# Patient Record
Sex: Female | Born: 1955 | Race: White | Hispanic: No | Marital: Married | Smoking: Former smoker
Health system: Southern US, Community
[De-identification: ages and names within clinical notes are randomized; demographics above are authoritative.]

## PROBLEM LIST (undated history)

## (undated) DIAGNOSIS — M1990 Unspecified osteoarthritis, unspecified site: Secondary | ICD-10-CM

## (undated) DIAGNOSIS — N950 Postmenopausal bleeding: Secondary | ICD-10-CM

## (undated) DIAGNOSIS — F32A Depression, unspecified: Secondary | ICD-10-CM

## (undated) DIAGNOSIS — T7840XA Allergy, unspecified, initial encounter: Secondary | ICD-10-CM

## (undated) DIAGNOSIS — C801 Malignant (primary) neoplasm, unspecified: Secondary | ICD-10-CM

## (undated) DIAGNOSIS — F419 Anxiety disorder, unspecified: Secondary | ICD-10-CM

## (undated) DIAGNOSIS — C4491 Basal cell carcinoma of skin, unspecified: Secondary | ICD-10-CM

## (undated) HISTORY — PX: COLONOSCOPY: SHX174

## (undated) HISTORY — DX: Basal cell carcinoma of skin, unspecified: C44.91

## (undated) HISTORY — DX: Allergy, unspecified, initial encounter: T78.40XA

## (undated) HISTORY — PX: DILATION AND CURETTAGE OF UTERUS: SHX78

---
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
## 2004-09-18 ENCOUNTER — Ambulatory Visit: Payer: Self-pay

## 2005-10-09 ENCOUNTER — Ambulatory Visit: Payer: Self-pay

## 2006-10-14 ENCOUNTER — Ambulatory Visit: Payer: Self-pay

## 2006-12-03 ENCOUNTER — Ambulatory Visit: Payer: Self-pay | Admitting: Gastroenterology

## 2007-10-15 ENCOUNTER — Ambulatory Visit: Payer: Self-pay

## 2008-10-18 ENCOUNTER — Ambulatory Visit: Payer: Self-pay

## 2009-10-19 ENCOUNTER — Ambulatory Visit: Payer: Self-pay

## 2010-10-24 ENCOUNTER — Ambulatory Visit: Payer: Self-pay

## 2010-11-05 ENCOUNTER — Ambulatory Visit: Payer: Self-pay

## 2011-02-12 ENCOUNTER — Ambulatory Visit: Payer: Self-pay

## 2011-10-28 ENCOUNTER — Ambulatory Visit: Payer: Self-pay

## 2012-10-28 ENCOUNTER — Ambulatory Visit: Payer: Self-pay

## 2013-11-02 ENCOUNTER — Ambulatory Visit: Payer: Self-pay

## 2014-09-27 ENCOUNTER — Other Ambulatory Visit: Payer: Self-pay | Admitting: Obstetrics and Gynecology

## 2014-09-27 DIAGNOSIS — Z1231 Encounter for screening mammogram for malignant neoplasm of breast: Secondary | ICD-10-CM

## 2014-09-29 ENCOUNTER — Encounter: Payer: Self-pay | Admitting: *Deleted

## 2014-10-04 ENCOUNTER — Ambulatory Visit: Payer: Self-pay | Admitting: Unknown Physician Specialty

## 2014-11-07 ENCOUNTER — Ambulatory Visit
Admission: RE | Admit: 2014-11-07 | Discharge: 2014-11-07 | Disposition: A | Discharge status: Home / Self Care | Payer: BLUE CROSS/BLUE SHIELD | Source: Ambulatory Visit | Attending: Obstetrics and Gynecology | Admitting: Obstetrics and Gynecology

## 2014-11-07 DIAGNOSIS — Z1231 Encounter for screening mammogram for malignant neoplasm of breast: Secondary | ICD-10-CM | POA: Insufficient documentation

## 2014-11-07 HISTORY — DX: Malignant (primary) neoplasm, unspecified: C80.1

## 2015-10-03 ENCOUNTER — Other Ambulatory Visit: Payer: Self-pay | Admitting: Obstetrics and Gynecology

## 2015-10-03 DIAGNOSIS — Z1231 Encounter for screening mammogram for malignant neoplasm of breast: Secondary | ICD-10-CM

## 2015-11-08 ENCOUNTER — Ambulatory Visit
Admission: RE | Admit: 2015-11-08 | Discharge: 2015-11-08 | Disposition: A | Discharge status: Home / Self Care | Payer: BLUE CROSS/BLUE SHIELD | Source: Ambulatory Visit | Attending: Obstetrics and Gynecology | Admitting: Obstetrics and Gynecology

## 2015-11-08 ENCOUNTER — Other Ambulatory Visit: Payer: Self-pay | Admitting: Obstetrics and Gynecology

## 2015-11-08 DIAGNOSIS — Z1231 Encounter for screening mammogram for malignant neoplasm of breast: Secondary | ICD-10-CM | POA: Diagnosis present

## 2016-10-03 ENCOUNTER — Other Ambulatory Visit: Payer: Self-pay | Admitting: Obstetrics and Gynecology

## 2016-10-03 DIAGNOSIS — Z1231 Encounter for screening mammogram for malignant neoplasm of breast: Secondary | ICD-10-CM

## 2016-11-08 ENCOUNTER — Ambulatory Visit
Admission: RE | Admit: 2016-11-08 | Discharge: 2016-11-08 | Disposition: A | Discharge status: Home / Self Care | Payer: BLUE CROSS/BLUE SHIELD | Source: Ambulatory Visit | Attending: Obstetrics and Gynecology | Admitting: Obstetrics and Gynecology

## 2016-11-08 DIAGNOSIS — Z1231 Encounter for screening mammogram for malignant neoplasm of breast: Secondary | ICD-10-CM | POA: Insufficient documentation

## 2017-10-07 ENCOUNTER — Other Ambulatory Visit: Payer: Self-pay | Admitting: Obstetrics and Gynecology

## 2017-10-07 DIAGNOSIS — Z1231 Encounter for screening mammogram for malignant neoplasm of breast: Secondary | ICD-10-CM

## 2017-11-10 ENCOUNTER — Ambulatory Visit
Admission: RE | Admit: 2017-11-10 | Discharge: 2017-11-10 | Disposition: A | Discharge status: Home / Self Care | Payer: BLUE CROSS/BLUE SHIELD | Source: Ambulatory Visit | Attending: Obstetrics and Gynecology | Admitting: Obstetrics and Gynecology

## 2017-11-10 DIAGNOSIS — Z1231 Encounter for screening mammogram for malignant neoplasm of breast: Secondary | ICD-10-CM | POA: Insufficient documentation

## 2018-11-12 ENCOUNTER — Other Ambulatory Visit: Payer: Self-pay | Admitting: Obstetrics and Gynecology

## 2018-11-12 DIAGNOSIS — Z1231 Encounter for screening mammogram for malignant neoplasm of breast: Secondary | ICD-10-CM

## 2018-12-18 ENCOUNTER — Other Ambulatory Visit: Payer: Self-pay

## 2018-12-18 ENCOUNTER — Ambulatory Visit
Admission: RE | Admit: 2018-12-18 | Discharge: 2018-12-18 | Disposition: A | Discharge status: Home / Self Care | Payer: BC Managed Care – PPO | Source: Ambulatory Visit | Attending: Obstetrics and Gynecology | Admitting: Obstetrics and Gynecology

## 2018-12-18 DIAGNOSIS — Z1231 Encounter for screening mammogram for malignant neoplasm of breast: Secondary | ICD-10-CM | POA: Insufficient documentation

## 2019-11-16 ENCOUNTER — Other Ambulatory Visit: Payer: Self-pay | Admitting: Obstetrics and Gynecology

## 2019-11-16 DIAGNOSIS — Z1231 Encounter for screening mammogram for malignant neoplasm of breast: Secondary | ICD-10-CM

## 2019-12-06 ENCOUNTER — Other Ambulatory Visit: Payer: Self-pay | Admitting: Obstetrics and Gynecology

## 2019-12-21 ENCOUNTER — Ambulatory Visit
Admission: RE | Admit: 2019-12-21 | Discharge: 2019-12-21 | Disposition: A | Discharge status: Home / Self Care | Payer: BC Managed Care – PPO | Source: Ambulatory Visit | Attending: Obstetrics and Gynecology | Admitting: Obstetrics and Gynecology

## 2019-12-21 ENCOUNTER — Other Ambulatory Visit: Payer: Self-pay

## 2019-12-21 DIAGNOSIS — Z1231 Encounter for screening mammogram for malignant neoplasm of breast: Secondary | ICD-10-CM | POA: Diagnosis not present

## 2019-12-22 ENCOUNTER — Other Ambulatory Visit: Payer: Self-pay | Admitting: Obstetrics and Gynecology

## 2019-12-22 DIAGNOSIS — N6310 Unspecified lump in the right breast, unspecified quadrant: Secondary | ICD-10-CM

## 2019-12-22 DIAGNOSIS — N6459 Other signs and symptoms in breast: Secondary | ICD-10-CM

## 2019-12-29 ENCOUNTER — Other Ambulatory Visit: Payer: Self-pay | Admitting: Obstetrics and Gynecology

## 2019-12-29 DIAGNOSIS — R928 Other abnormal and inconclusive findings on diagnostic imaging of breast: Secondary | ICD-10-CM

## 2019-12-29 DIAGNOSIS — N6310 Unspecified lump in the right breast, unspecified quadrant: Secondary | ICD-10-CM

## 2019-12-30 NOTE — H&P (Signed)
Hannah Burke is a 64 y.o. female here for Pre Op Consulting (sign consents)  Body mass index is 26.57 kg/m.  History of Present Illness:  Patient presents for a preoperative visit for D&C, hysteroscopy, and Polypectomy. Indications are postmenopausal bleeding, endometrial polyps, thickened endometrium. Work up with SIS also revealed polyps and possible submucosal fibroids.   Workup has included: Last pap smear: 10/2019 neg/neg EMBx 04/2019: Inactive, benign  SIS 05/2019: polyps and submucosal fibroids  TVUS 05/2019:  Retroverted Ut=5.24 x 3.53 x 4.45 cm Endometrium=14.34 mm Polyps seen, 1=0.62 x 0.43 x 0.76 cm,  2=0.76 x 0.85 x 1.06 cm Neither ov seen,  NoAdnexal masses seen  No pertinent surgical hx  Past Medical History:  has a past medical history of Osteoporosis, post-menopausal.  Past Surgical History:  has a past surgical history that includes excision of basel cell and Colonoscopy (12/03/2014). Family History: family history includes Diabetes in her father; Osteoporosis (Thinning of bones) in her mother; Stroke in her mother; Thyroid disease in her mother. Social History:  reports that she has quit smoking. She has never used smokeless tobacco. She reports current alcohol use. She reports that she does not use drugs. OB/GYN History:  OB History    Gravida  2   Para  2   Term  2   Preterm      AB      Living  2     SAB      TAB      Ectopic      Molar      Multiple      Live Births           Allergies: is allergic to amoxicillin and penicillins. Medications:  Current Outpatient Medications:  .  cholecalciferol (CHOLECALCIFEROL) 1,000 unit tablet, Take by mouth., Disp: , Rfl:  .  docosahexanoic acid/epa (FISH OIL ORAL), Take by mouth, Disp: , Rfl:  .  loratadine (CLARITIN) 10 mg capsule, Take 10 mg by mouth once daily., Disp: , Rfl:  .  nystatin (MYCOSTATIN) 100,000 unit/gram powder, Apply topically 2 (two) times daily, Disp: 30 g, Rfl: 1 .  wheat  dextrin/calc gluc,lact (BENEFIBER PLUS CALCIUM ORAL), Take by mouth, Disp: , Rfl:  .  alendronate (FOSAMAX) 70 MG tablet, TAKE 1 TAB BY MOUTH EACH WEEK , ON A EMPTY STOMACH BEFORE BREAKFAST WITH 8OZ OF WATER AND REMAIN UPRIGHT FOR 30 MIN (Patient not taking: Reported on 05/25/2019), Disp: 4 tablet, Rfl: 11 .  alendronate (FOSAMAX) 70 MG tablet, TAKE ONE TABLET BY MOUTH EACH WEEK, ON AN EMPTY STOMACH BEFORE BREAKFAST WITH 8oz OF WATER AND REMAIN UPRIGHT FOR :30 (Patient not taking: Reported on 05/25/2019), Disp: 4 tablet, Rfl: 12 .  aspirin 81 MG EC tablet, Take 81 mg by mouth once daily., Disp: , Rfl:  .  biotin 1 mg tablet, Take 1,000 mcg by mouth 3 (three) times daily (Patient not taking: Reported on 11/16/2019  ), Disp: , Rfl:    Exam:   BP 110/78   Ht 160 cm (5\' 3" )   Wt 68 kg (150 lb)   BMI 26.57 kg/m   Constitutional:  General appearance: Well nourished, well developed female in no acute distress.  Neuro/psych:  Normal mood and affect. No gross motor deficits. Neck:  Supple, normal appearance.  Respiratory:  Normal respiratory effort, no use of accessory muscles Skin:  No visible rashes or external lesions  Impression:   The primary encounter diagnosis was Preoperative clearance. Diagnoses of Postmenopausal bleeding, Endometrial  polyp, and Endometrial thickening on ultrasound were also pertinent to this visit.  Plan:   1. Preoperative visit: D&C hysteroscopy, Polypectomy. Consents signed today. Risks of surgery were discussed with the patient including but not limited to: bleeding which may require transfusion; infection which may require antibiotics; injury to uterus or surrounding organs; intrauterine scarring which may impair future fertility; need for additional procedures including laparotomy or laparoscopy; and other postoperative/anesthesia complications. Written informed consent was obtained.  Return for Postop check.  Diagnoses and all orders for this visit:  Preoperative  clearance  Postmenopausal bleeding  Endometrial polyp  Endometrial thickening on ultrasound

## 2020-01-07 ENCOUNTER — Ambulatory Visit
Admission: RE | Admit: 2020-01-07 | Discharge: 2020-01-07 | Disposition: A | Discharge status: Home / Self Care | Payer: Medicare Other | Source: Ambulatory Visit | Attending: Obstetrics and Gynecology | Admitting: Obstetrics and Gynecology

## 2020-01-07 DIAGNOSIS — N6310 Unspecified lump in the right breast, unspecified quadrant: Secondary | ICD-10-CM

## 2020-01-07 DIAGNOSIS — R928 Other abnormal and inconclusive findings on diagnostic imaging of breast: Secondary | ICD-10-CM | POA: Insufficient documentation

## 2020-01-13 ENCOUNTER — Encounter
Admission: RE | Admit: 2020-01-13 | Discharge: 2020-01-13 | Disposition: A | Discharge status: Home / Self Care | Payer: Medicare Other | Source: Ambulatory Visit | Attending: Obstetrics and Gynecology | Admitting: Obstetrics and Gynecology

## 2020-01-13 ENCOUNTER — Other Ambulatory Visit: Payer: Self-pay

## 2020-01-13 NOTE — Patient Instructions (Signed)
Your procedure is scheduled on: Friday 9/24 Report to Day Surgery. To find out your arrival time please call [phone] between 1PM - 3PM on Thurs 9/23  Remember: Instructions that are not followed completely may result in serious medical risk,  up to and including death, or upon the discretion of your surgeon and anesthesiologist your  surgery may need to be rescheduled.     _X__ 1. Do not eat food after midnight the night before your procedure.                 No chewing gum or hard candies. You may drink clear liquids up to 2 hours                 before you are scheduled to arrive for your surgery- DO not drink clear                 liquids within 2 hours of the start of your surgery.                 Clear Liquids include:  water, apple juice without pulp, clear Gatorade, G2 or                  Gatorade Zero (avoid Red/Purple/Blue), Black Coffee or Tea (Do not add                 anything to coffee or tea). __x___2.   Complete the "Ensure Clear Pre-surgery Clear Carbohydrate Drink" provided to you, 2 hours before arrival.   __X__2.  On the morning of surgery brush your teeth with toothpaste and water, you                may rinse your mouth with mouthwash if you wish.  Do not swallow any toothpaste of mouthwash.     _X__ 3.  No Alcohol for 24 hours before or after surgery.   ___ 4.  Do Not Smoke or use e-cigarettes For 24 Hours Prior to Your Surgery.                 Do not use any chewable tobacco products for at least 6 hours prior to                 Surgery.  ___  5.  Do not use any recreational drugs (marijuana, cocaine, heroin, ecstasy, MDMA or other)                For at least one week prior to your surgery.  Combination of these drugs with anesthesia                May have life threatening results.  ____  6.  Bring all medications with you on the day of surgery if instructed.   _x___  7.  Notify your doctor if there is any change in your medical  condition      (cold, fever, infections).     Do not wear jewelry, make-up, hairpins, clips or nail polish. Do not wear lotions, powders, or perfumes. You may wear deodorant. Do not shave 48 hours prior to surgery.  Do not bring valuables to the hospital.    [facility] is not responsible for any belongings or valuables.  Contacts, dentures or bridgework may not be worn into surgery. Leave your suitcase in the car. After surgery it may be brought to your room. For patients admitted to the hospital, discharge time is determined by your treatment  team.   Patients discharged the day of surgery will not be allowed to drive home.   Make arrangements for someone to be with you for the first 24 hours of your Same Day Discharge.    Please read over the following fact sheets that you were given:   Incentive spirometer    __x__ Take these medicines the morning of surgery with A SIP OF WATER:    1. none  2.   3.   4.  5.  6.  ____ Fleet Enema (as directed)   ____ Use CHG Soap (or wipes) as directed  ____ Use Benzoyl Peroxide Gel as instructed  ____ Use inhalers on the day of surgery  ____ Stop metformin 2 days prior to surgery    ____ Take 1/2 of usual insulin dose the night before surgery. No insulin the morning          of surgery.   _x___ Stop aspirin tomorrow  __x__ Stop Anti-inflammatories no ibuprofen aleve or motrin until after the surgery.   May take tylenol   __x__ Stop supplements  Omega 3 1200 MG CAPS tomorrow   ____ Bring C-Pap to the hospital.    If you have any questions regarding your pre-procedure instructions,  Please call Pre-admit Testing at [facility] [place]

## 2020-01-19 ENCOUNTER — Encounter
Admission: RE | Admit: 2020-01-19 | Discharge: 2020-01-19 | Disposition: A | Discharge status: Home / Self Care | Payer: Medicare Other | Source: Ambulatory Visit | Attending: Obstetrics and Gynecology | Admitting: Obstetrics and Gynecology

## 2020-01-19 ENCOUNTER — Other Ambulatory Visit: Payer: BC Managed Care – PPO

## 2020-01-19 ENCOUNTER — Other Ambulatory Visit: Payer: Self-pay

## 2020-01-19 DIAGNOSIS — R54 Age-related physical debility: Secondary | ICD-10-CM | POA: Insufficient documentation

## 2020-01-19 DIAGNOSIS — Z01818 Encounter for other preprocedural examination: Secondary | ICD-10-CM | POA: Insufficient documentation

## 2020-01-19 DIAGNOSIS — Z20822 Contact with and (suspected) exposure to covid-19: Secondary | ICD-10-CM | POA: Insufficient documentation

## 2020-01-19 LAB — TYPE AND SCREEN
ABO/RH(D): O NEG
Antibody Screen: NEGATIVE

## 2020-01-20 LAB — SARS CORONAVIRUS 2 (TAT 6-24 HRS): SARS Coronavirus 2: NEGATIVE

## 2020-01-21 ENCOUNTER — Ambulatory Visit: Payer: Medicare Other | Admitting: Certified Registered Nurse Anesthetist

## 2020-01-21 ENCOUNTER — Ambulatory Visit
Admission: RE | Admit: 2020-01-21 | Discharge: 2020-01-21 | Disposition: A | Discharge status: Home / Self Care | Payer: Medicare Other | Attending: Obstetrics and Gynecology | Admitting: Obstetrics and Gynecology

## 2020-01-21 ENCOUNTER — Encounter
Admission: RE | Disposition: A | Discharge status: Home / Self Care | Payer: Self-pay | Source: Home / Self Care | Attending: Obstetrics and Gynecology

## 2020-01-21 ENCOUNTER — Encounter: Payer: Self-pay | Admitting: Obstetrics and Gynecology

## 2020-01-21 DIAGNOSIS — Z823 Family history of stroke: Secondary | ICD-10-CM | POA: Insufficient documentation

## 2020-01-21 DIAGNOSIS — Z7982 Long term (current) use of aspirin: Secondary | ICD-10-CM | POA: Diagnosis not present

## 2020-01-21 DIAGNOSIS — Z833 Family history of diabetes mellitus: Secondary | ICD-10-CM | POA: Diagnosis not present

## 2020-01-21 DIAGNOSIS — N950 Postmenopausal bleeding: Secondary | ICD-10-CM | POA: Insufficient documentation

## 2020-01-21 DIAGNOSIS — Z79899 Other long term (current) drug therapy: Secondary | ICD-10-CM | POA: Insufficient documentation

## 2020-01-21 DIAGNOSIS — R9389 Abnormal findings on diagnostic imaging of other specified body structures: Secondary | ICD-10-CM | POA: Diagnosis not present

## 2020-01-21 DIAGNOSIS — Z880 Allergy status to penicillin: Secondary | ICD-10-CM | POA: Insufficient documentation

## 2020-01-21 DIAGNOSIS — M810 Age-related osteoporosis without current pathological fracture: Secondary | ICD-10-CM | POA: Insufficient documentation

## 2020-01-21 DIAGNOSIS — N840 Polyp of corpus uteri: Secondary | ICD-10-CM | POA: Diagnosis not present

## 2020-01-21 DIAGNOSIS — Z8349 Family history of other endocrine, nutritional and metabolic diseases: Secondary | ICD-10-CM | POA: Insufficient documentation

## 2020-01-21 DIAGNOSIS — D250 Submucous leiomyoma of uterus: Secondary | ICD-10-CM | POA: Diagnosis not present

## 2020-01-21 DIAGNOSIS — Z8262 Family history of osteoporosis: Secondary | ICD-10-CM | POA: Diagnosis not present

## 2020-01-21 DIAGNOSIS — Z87891 Personal history of nicotine dependence: Secondary | ICD-10-CM | POA: Diagnosis not present

## 2020-01-21 DIAGNOSIS — Z7983 Long term (current) use of bisphosphonates: Secondary | ICD-10-CM | POA: Insufficient documentation

## 2020-01-21 HISTORY — PX: HYSTEROSCOPY WITH D & C: SHX1775

## 2020-01-21 LAB — ABO/RH: ABO/RH(D): O NEG

## 2020-01-21 SURGERY — DILATATION AND CURETTAGE /HYSTEROSCOPY
Anesthesia: General | Site: Uterus

## 2020-01-21 MED ORDER — FENTANYL CITRATE (PF) 100 MCG/2ML IJ SOLN
INTRAMUSCULAR | Status: DC | PRN
Start: 2020-01-21 — End: 2020-01-21
  Administered 2020-01-21: 25 ug via INTRAVENOUS

## 2020-01-21 MED ORDER — MIDAZOLAM HCL 2 MG/2ML IJ SOLN
INTRAMUSCULAR | Status: AC
  Filled 2020-01-21: qty 2

## 2020-01-21 MED ORDER — DEXAMETHASONE SODIUM PHOSPHATE 10 MG/ML IJ SOLN
INTRAMUSCULAR | Status: DC | PRN
  Administered 2020-01-21: 8 mg via INTRAVENOUS

## 2020-01-21 MED ORDER — PROPOFOL 10 MG/ML IV BOLUS
INTRAVENOUS | Status: DC | PRN
  Administered 2020-01-21: 200 mg via INTRAVENOUS

## 2020-01-21 MED ORDER — LACTATED RINGERS IV SOLN: INTRAVENOUS | Status: DC

## 2020-01-21 MED ORDER — ACETAMINOPHEN 10 MG/ML IV SOLN
INTRAVENOUS | Status: AC
  Filled 2020-01-21: qty 100

## 2020-01-21 MED ORDER — PROPOFOL 10 MG/ML IV BOLUS
INTRAVENOUS | Status: AC
  Filled 2020-01-21: qty 60

## 2020-01-21 MED ORDER — LIDOCAINE HCL (CARDIAC) PF 100 MG/5ML IV SOSY
PREFILLED_SYRINGE | INTRAVENOUS | Status: DC | PRN
  Administered 2020-01-21: 100 mg via INTRAVENOUS

## 2020-01-21 MED ORDER — CHLORHEXIDINE GLUCONATE 0.12 % MT SOLN: 15.0000 mL | Freq: Once | OROMUCOSAL | Status: AC

## 2020-01-21 MED ORDER — ACETAMINOPHEN 10 MG/ML IV SOLN
INTRAVENOUS | Status: DC | PRN
  Administered 2020-01-21: 1000 mg via INTRAVENOUS

## 2020-01-21 MED ORDER — IBUPROFEN 800 MG PO TABS: 800.0000 mg | ORAL_TABLET | Freq: Three times a day (TID) | ORAL | 1 refills | Status: AC

## 2020-01-21 MED ORDER — ONDANSETRON HCL 4 MG/2ML IJ SOLN: 4.0000 mg | Freq: Once | INTRAMUSCULAR | Status: DC | PRN

## 2020-01-21 MED ORDER — IBUPROFEN 800 MG PO TABS
800.0000 mg | ORAL_TABLET | Freq: Once | ORAL | Status: AC
  Filled 2020-01-21: qty 1

## 2020-01-21 MED ORDER — FAMOTIDINE 20 MG PO TABS
20.0000 mg | ORAL_TABLET | Freq: Once | ORAL | Status: AC
  Administered 2020-01-21: 20 mg via ORAL

## 2020-01-21 MED ORDER — PHENYLEPHRINE HCL (PRESSORS) 10 MG/ML IV SOLN
INTRAVENOUS | Status: DC | PRN
  Administered 2020-01-21: 100 ug via INTRAVENOUS

## 2020-01-21 MED ORDER — CHLORHEXIDINE GLUCONATE 0.12 % MT SOLN
OROMUCOSAL | Status: AC
  Administered 2020-01-21: 15 mL via OROMUCOSAL
  Filled 2020-01-21: qty 15

## 2020-01-21 MED ORDER — ONDANSETRON HCL 4 MG/2ML IJ SOLN
INTRAMUSCULAR | Status: DC | PRN
  Administered 2020-01-21: 4 mg via INTRAVENOUS

## 2020-01-21 MED ORDER — IBUPROFEN 800 MG PO TABS
ORAL_TABLET | ORAL | Status: AC
  Administered 2020-01-21: 800 mg via ORAL
  Filled 2020-01-21: qty 1

## 2020-01-21 MED ORDER — ORAL CARE MOUTH RINSE: 15.0000 mL | Freq: Once | OROMUCOSAL | Status: AC

## 2020-01-21 MED ORDER — FAMOTIDINE 20 MG PO TABS
ORAL_TABLET | ORAL | Status: AC
  Filled 2020-01-21: qty 1

## 2020-01-21 MED ORDER — MIDAZOLAM HCL 2 MG/2ML IJ SOLN
INTRAMUSCULAR | Status: DC | PRN
  Administered 2020-01-21: 2 mg via INTRAVENOUS

## 2020-01-21 MED ORDER — FENTANYL CITRATE (PF) 100 MCG/2ML IJ SOLN: 25.0000 ug | INTRAMUSCULAR | Status: DC | PRN

## 2020-01-21 MED ORDER — FENTANYL CITRATE (PF) 100 MCG/2ML IJ SOLN
INTRAMUSCULAR | Status: AC
  Filled 2020-01-21: qty 2

## 2020-01-21 SURGICAL SUPPLY — 22 items
BAG INFUSER PRESSURE 100CC (MISCELLANEOUS) ×3 IMPLANT
CANISTER SUCT 3000ML PPV (MISCELLANEOUS) ×3 IMPLANT
CATH ROBINSON RED A/P 16FR (CATHETERS) ×3 IMPLANT
COVER WAND RF STERILE (DRAPES) ×3 IMPLANT
DEVICE MYOSURE LITE (MISCELLANEOUS) ×3 IMPLANT
ELECT REM PT RETURN 9FT ADLT (ELECTROSURGICAL) ×3
ELECTRODE REM PT RTRN 9FT ADLT (ELECTROSURGICAL) ×1 IMPLANT
GAUZE 4X4 16PLY RFD (DISPOSABLE) ×3 IMPLANT
GLOVE BIO SURGEON STRL SZ7 (GLOVE) ×3 IMPLANT
GLOVE INDICATOR 7.5 STRL GRN (GLOVE) ×3 IMPLANT
GOWN STRL REUS W/ TWL LRG LVL3 (GOWN DISPOSABLE) ×2 IMPLANT
GOWN STRL REUS W/TWL LRG LVL3 (GOWN DISPOSABLE) ×6
KIT PROCEDURE FLUENT (KITS) ×3 IMPLANT
KIT TURNOVER CYSTO (KITS) ×3 IMPLANT
PACK DNC HYST (MISCELLANEOUS) ×3 IMPLANT
PAD OB MATERNITY 4.3X12.25 (PERSONAL CARE ITEMS) ×3 IMPLANT
PAD PREP 24X41 OB/GYN DISP (PERSONAL CARE ITEMS) ×3 IMPLANT
SOL .9 NS 3000ML IRR  AL (IV SOLUTION) ×2
SOL .9 NS 3000ML IRR AL (IV SOLUTION) ×1
SOL .9 NS 3000ML IRR UROMATIC (IV SOLUTION) ×1 IMPLANT
TUBING CONNECTING 10 (TUBING) ×2 IMPLANT
TUBING CONNECTING 10' (TUBING) ×1

## 2020-01-21 NOTE — Transfer of Care (Signed)
Immediate Anesthesia Transfer of Care Note  Patient: Hannah Burke  Procedure(s) Performed: DILATATION AND CURETTAGE /HYSTEROSCOPY, POLYPECTOMY (N/A Uterus)  Patient Location: PACU  Anesthesia Type:General  Level of Consciousness: awake, oriented, drowsy and patient cooperative  Airway & Oxygen Therapy: Patient Spontanous Breathing  Post-op Assessment: Report given to RN, Post -op Vital signs reviewed and stable and Patient moving all extremities  Post vital signs: Reviewed and stable  Last Vitals:  Vitals Value Taken Time  BP 125/85 01/21/20 0833  Temp 36.4 C 01/21/20 0833  Pulse 79 01/21/20 0834  Resp 19 01/21/20 0834  SpO2 99 % 01/21/20 0834  Vitals shown include unvalidated device data.  Last Pain:  Vitals:   01/21/20 0833  TempSrc:   PainSc: 3          Complications: No complications documented.

## 2020-01-21 NOTE — OR Nursing (Signed)
Per Dr. Leafy Ro secure-chat, patient may resume taking aspirin today 01/21/20.  Added to discharge instructions/med section.

## 2020-01-21 NOTE — Anesthesia Postprocedure Evaluation (Signed)
Anesthesia Post Note  Patient: Hannah Burke  Procedure(s) Performed: DILATATION AND CURETTAGE /HYSTEROSCOPY, POLYPECTOMY (N/A Uterus)  Patient location during evaluation: PACU Anesthesia Type: General Level of consciousness: awake and alert Pain management: pain level controlled Vital Signs Assessment: post-procedure vital signs reviewed and stable Respiratory status: spontaneous breathing, nonlabored ventilation, respiratory function stable and patient connected to nasal cannula oxygen Cardiovascular status: blood pressure returned to baseline and stable Postop Assessment: no apparent nausea or vomiting Anesthetic complications: no   No complications documented.   Last Vitals:  Vitals:   01/21/20 0903 01/21/20 0918  BP: 140/67 130/79  Pulse: 69 67  Resp: 11 16  Temp: (!) 36.2 C (!) 36.2 C  SpO2: 100% 100%    Last Pain:  Vitals:   01/21/20 0918  TempSrc: Temporal  PainSc: [place] Mariana Goytia

## 2020-01-21 NOTE — Anesthesia Procedure Notes (Signed)
Procedure Name: LMA Insertion Date/Time: 01/21/2020 7:42 AM Performed by: Lowry Bowl, CRNA Pre-anesthesia Checklist: Patient being monitored, Suction available, Emergency Drugs available and Patient identified Patient Re-evaluated:Patient Re-evaluated prior to induction Oxygen Delivery Method: Circle system utilized Preoxygenation: Pre-oxygenation with 100% oxygen Induction Type: IV induction Ventilation: Mask ventilation without difficulty LMA: LMA inserted LMA Size: 3.0 Number of attempts: 1 Placement Confirmation: positive ETCO2 and breath sounds checked- equal and bilateral Tube secured with: Tape Dental Injury: Teeth and Oropharynx as per pre-operative assessment

## 2020-01-21 NOTE — Discharge Instructions (Addendum)
Discharge instructions after a hysteroscopy with dilation and curettage  Signs and Symptoms to Report  Call our office at [phone] if you have any of the following:   . Fever over 100.4 degrees or higher . Severe stomach pain not relieved with pain medications . Bright red bleeding that's heavier than a period that does not slow with rest after the first 24 hours . To go the bathroom a lot (frequency), you can't hold your urine (urgency), or it hurts when you empty your bladder (urinate) . Chest pain . Shortness of breath . Pain in the calves of your legs . Severe nausea and vomiting not relieved with anti-nausea medications . Any concerns  What You Can Expect after Surgery . You may see some pink tinged, bloody fluid. This is normal. You may also have cramping for several days.   Activities after Your Discharge Follow these guidelines to help speed your recovery at home: . Don't drive if you are in pain or taking narcotic pain medicine. You may drive when you can safely slam on the brakes, turn the wheel forcefully, and rotate your torso comfortably. This is typically 4-7 days. Practice in a parking lot or side street prior to attempting to drive regularly.  . Ask others to help with household chores for 4 weeks. . Don't do strenuous activities, exercises, or sports like vacuuming, tennis, squash, etc. until your doctor says it is safe to do so. . Walk as you feel able. Rest often since it may take a week or two for your energy level to return to normal.  . You may climb stairs . Avoid constipation:   -Eat fruits, vegetables, and whole grains. Eat small meals as your appetite will take time to return to normal.   -Drink 6 to 8 glasses of water each day unless your doctor has told you to limit your fluids.   -Use a laxative or stool softener as needed if constipation becomes a problem. You may take Miralax, metamucil, Citrucil, Colace, [place], FiberCon, etc. If this does not  relieve the constipation, try two tablespoons of Milk Of Magnesia every 8 hours until your bowels move.  . You may shower.  . Do not get in a hot tub, swimming pool, etc. until your doctor agrees. . Do not douche, use tampons, or have sex until your doctor says it is okay, usually about 2 weeks. . Take your pain medicine when you need it. The medicine may not work as well if the pain is bad.  Take the medicines you were taking before surgery. Other medications you might need are pain medications (ibuprofen), medications for constipation (Colace) and nausea medications (Zofran).        AMBULATORY SURGERY  DISCHARGE INSTRUCTIONS   1) The drugs that you were given will stay in your system until tomorrow so for the next 24 hours you should not:  A) Drive an automobile B) Make any legal decisions C) Drink any alcoholic beverage   2) You may resume regular meals tomorrow.  Today it is better to start with liquids and gradually work up to solid foods.  You may eat anything you prefer, but it is better to start with liquids, then soup and crackers, and gradually work up to solid foods.   3) Please notify your doctor immediately if you have any unusual bleeding, trouble breathing, redness and pain at the surgery site, drainage, fever, or pain not relieved by medication.    4) Additional Instructions:          Please contact your physician with any problems or Same Day Surgery at [phone], Monday through Friday 6 am to 4 pm, or [facility] number at [phone]. 

## 2020-01-21 NOTE — Interval H&P Note (Signed)
History and Physical Interval Note:  01/21/2020 7:28 AM  Hannah Burke  has presented today for surgery, with the diagnosis of Burke menopausal bleeding.  The various methods of treatment have been discussed with the patient and family. After consideration of risks, benefits and other options for treatment, the patient has consented to  Procedure(s): DILATATION AND CURETTAGE /HYSTEROSCOPY, POLYPECTOMY (N/A) as a surgical intervention.  The patient's history has been reviewed, patient examined, no change in status, stable for surgery.  I have reviewed the patient's chart and labs.  Questions were answered to the patient's satisfaction.     Benjaman Kindler

## 2020-01-21 NOTE — Anesthesia Preprocedure Evaluation (Signed)
Anesthesia Evaluation  Patient identified by MRN, date of birth, ID band Patient awake    Reviewed: Allergy & Precautions, H&P , NPO status , Patient's Chart, lab work & pertinent test results, reviewed documented beta blocker date and time   Airway Mallampati: II  TM Distance: >3 FB Neck ROM: full    Dental  (+) Teeth Intact   Pulmonary neg pulmonary ROS, former smoker,    Pulmonary exam normal        Cardiovascular Exercise Tolerance: Good negative cardio ROS Normal cardiovascular exam Rate:Normal     Neuro/Psych negative neurological ROS  negative psych ROS   GI/Hepatic negative GI ROS, Neg liver ROS,   Endo/Other  negative endocrine ROS  Renal/GU negative Renal ROS  negative genitourinary   Musculoskeletal   Abdominal   Peds  Hematology negative hematology ROS (+)   Anesthesia Other Findings   Reproductive/Obstetrics negative OB ROS                             Anesthesia Physical Anesthesia Plan  ASA: II  Anesthesia Plan: General LMA   Post-op Pain Management:    Induction:   PONV Risk Score and Plan: 4 or greater  Airway Management Planned:   Additional Equipment:   Intra-op Plan:   Post-operative Plan:   Informed Consent: I have reviewed the patients History and Physical, chart, labs and discussed the procedure including the risks, benefits and alternatives for the proposed anesthesia with the patient or authorized representative who has indicated his/her understanding and acceptance.       Plan Discussed with: CRNA  Anesthesia Plan Comments:         Anesthesia Quick Evaluation

## 2020-01-21 NOTE — Op Note (Signed)
Operative Report Hysteroscopy with Dilation and Curettage   Indications: Postmenopausal bleeding   Pre-operative Diagnosis: Thickened endometrial stripe   Post-operative Diagnosis: same.  Procedure: 1. Exam under anesthesia 2. Fractional D&C 3. Hysteroscopy 4. Myosure polypectomy/myomectomy  Surgeon: Benjaman Kindler, MD  Assistant(s):  None  Anesthesia: General LMA anesthesia  Anesthesiologist: Molli Barrows, MD Anesthesiologist: Molli Barrows, MD CRNA: Lowry Bowl, CRNA  Estimated Blood Loss:  Minimal         Total IV Fluids: 674ml  Urine Output: 159ml  Total Fluid Deficit:  270 mL          Specimens: Endocervical curettings, endometrial curettings         Complications:  None; patient tolerated the procedure well.         Disposition: PACU - hemodynamically stable.         Condition: stable  Findings: Uterus measuring 7.5 cm by sound; normal cervix, vagina, perineum. Two fundal polyps, two possible fibroid vs polyp in the posterior lower uterine segment, normal tubal ostia.  Indication for procedure/Consents: 64 y.o. F here for scheduled surgery for the aforementioned diagnoses.  Risks of surgery were discussed with the patient including but not limited to: bleeding which may require transfusion; infection which may require antibiotics; injury to uterus or surrounding organs; intrauterine scarring which may impair future fertility; need for additional procedures including laparotomy or laparoscopy; and other postoperative/anesthesia complications. Written informed consent was obtained.    Procedure Details:  D&C/ Myosure  The patient was taken to the operating room where anesthesia was administered and was found to be adequate. After a formal and adequate timeout was performed, she was placed in the dorsal lithotomy position and examined with the above findings. She was then prepped and draped in the sterile manner. Her bladder was catheterized for an estimated  amount of clear, yellow urine. A weighed speculum was then placed in the patient's vagina and a single tooth tenaculum was applied to the anterior lip of the cervix.  Her cervix was serially dilated to 15 [place] using Hanks dilators. An ECC was performed. The hysteroscope was introduced under direct observation  Using lactated ringers as a distention medium to reveal the above findings. The uterine cavity was carefully examined, both ostia were recognized, and diffusely proliferative endometrium with the fibroid or polyp above were noted.   This was resected using the Myosure device.  After further careful visualization of the uterine cavity, the hysteroscope was removed under direct visualization.  A sharp curettage was then performed until there was a gritty texture in all four quadrants. The tenaculum was removed from the anterior lip of the cervix and the vaginal speculum was removed after noting good hemostasis.   The patient tolerated the procedure well and was taken to the recovery area awake and in stable condition. She received iv acetaminophen and Toradol prior to leaving the OR.  The patient will be discharged to home as per PACU criteria. Routine postoperative instructions given. She was prescribed Ibuprofen and Colace. She will follow up in the clinic in two weeks for postoperative evaluation.

## 2020-01-24 LAB — SURGICAL PATHOLOGY

## 2020-01-28 DIAGNOSIS — C801 Malignant (primary) neoplasm, unspecified: Secondary | ICD-10-CM

## 2020-01-28 HISTORY — DX: Malignant (primary) neoplasm, unspecified: C80.1

## 2020-02-09 ENCOUNTER — Other Ambulatory Visit: Payer: Self-pay | Admitting: Nurse Practitioner

## 2020-02-09 ENCOUNTER — Inpatient Hospital Stay: Payer: Medicare Other

## 2020-02-09 ENCOUNTER — Other Ambulatory Visit: Payer: Self-pay

## 2020-02-09 ENCOUNTER — Inpatient Hospital Stay: Payer: Medicare Other | Attending: Obstetrics and Gynecology | Admitting: Obstetrics and Gynecology

## 2020-02-09 VITALS — BP 156/97 | HR 82 | Resp 16 | Ht 63.6 in | Wt 152.6 lb

## 2020-02-09 DIAGNOSIS — Z7982 Long term (current) use of aspirin: Secondary | ICD-10-CM | POA: Insufficient documentation

## 2020-02-09 DIAGNOSIS — Z87891 Personal history of nicotine dependence: Secondary | ICD-10-CM | POA: Insufficient documentation

## 2020-02-09 DIAGNOSIS — N8502 Endometrial intraepithelial neoplasia [EIN]: Secondary | ICD-10-CM

## 2020-02-09 LAB — COMPREHENSIVE METABOLIC PANEL
ALT: 32 U/L (ref 0–44)
AST: 24 U/L (ref 15–41)
Albumin: 4.1 g/dL (ref 3.5–5.0)
Alkaline Phosphatase: 75 U/L (ref 38–126)
Anion gap: 9 (ref 5–15)
BUN: 10 mg/dL (ref 8–23)
CO2: 27 mmol/L (ref 22–32)
Calcium: 9.4 mg/dL (ref 8.9–10.3)
Chloride: 102 mmol/L (ref 98–111)
Creatinine, Ser: 0.67 mg/dL (ref 0.44–1.00)
GFR, Estimated: >60 mL/min (ref >60)
Glucose, Bld: 111 mg/dL — ABNORMAL HIGH (ref 70–99)
Potassium: 4.5 mmol/L (ref 3.5–5.1)
Sodium: 138 mmol/L (ref 135–145)
Total Bilirubin: 0.7 mg/dL (ref 0.3–1.2)
Total Protein: 7.9 g/dL (ref 6.5–8.1)

## 2020-02-09 LAB — CBC WITH DIFFERENTIAL/PLATELET
Abs Immature Granulocytes: 0.02 10*3/uL (ref 0.00–0.07)
Basophils Absolute: 0.1 10*3/uL (ref 0.0–0.1)
Basophils Relative: 1 %
Eosinophils Absolute: 0.3 10*3/uL (ref 0.0–0.5)
Eosinophils Relative: 4 %
HCT: 44.6 % (ref 36.0–46.0)
Hemoglobin: 15.3 g/dL — ABNORMAL HIGH (ref 12.0–15.0)
Immature Granulocytes: 0 %
Lymphocytes Relative: 35 %
Lymphs Abs: 2.5 10*3/uL (ref 0.7–4.0)
MCH: 30.9 pg (ref 26.0–34.0)
MCHC: 34.3 g/dL (ref 30.0–36.0)
MCV: 90.1 fL (ref 80.0–100.0)
Monocytes Absolute: 0.6 10*3/uL (ref 0.1–1.0)
Monocytes Relative: 8 %
Neutro Abs: 3.8 10*3/uL (ref 1.7–7.7)
Neutrophils Relative %: 52 %
Platelets: 321 10*3/uL (ref 150–400)
RBC: 4.95 MIL/uL (ref 3.87–5.11)
RDW: 13.5 % (ref 11.5–15.5)
WBC: 7.3 10*3/uL (ref 4.0–10.5)
nRBC: 0 % (ref 0.0–0.2)

## 2020-02-09 NOTE — Progress Notes (Signed)
Gynecologic Oncology Consult Visit   Referring Provider: Benjaman Kindler, MD  Chief Concern: EIN  Subjective:  Hannah Burke is a 64 y.o. G2P2 female who is seen in consultation from Dr. Leafy Ro for EIN.  She presented initially with postmenopausal bleeding. Work up included the following: Last pap smear: 10/2019 neg/neg EMBx1/2021: Inactive, benign  SIS 05/2019: polyps and submucosal fibroids  TVUS 05/2019:  Retroverted Ut=5.24 x 3.53 x 4.45 cm Endometrium=14.34 mm Polyps seen,1=0.62 x 0.43 x 0.76 cm, 2=0.76 x 0.85 x 1.06 cm Neither ov seen,NoAdnexal masses seen  On 01/21/2020 she underwent exam under anesthesia, fractional D&C, hysteroscopy, and Myosure polypectomy/myomectomy. Uterus sounded to 7.5 cm and findings demonstrated two fundal polyps, two possible fibroid vs polyp in the posterior lower uterine segment, normal tubal ostia.  Her final pathology is noted below.    DIAGNOSIS:  A. ENDOCERVIX, CURETTAGE:  - BENIGN ENDOCERVICAL AND SQUAMOUS MUCOSA.  - NEGATIVE FOR DYSPLASIA AND MALIGNANCY.   B. ENDOMETRIUM, CURETTAGE:  - SCANT, SUPERFICIAL FRAGMENTS OF EIN / ATYPICAL HYPERPLASIA, SIMILAR TO  THAT SEEN IN PART B.   C. 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   Problem List: Patient Active Problem List   Diagnosis Date Noted  . 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 Allergy   . Cancer ([place])    basal cell    Past Surgical History: Past Surgical History:  Procedure Laterality Date  . HYSTEROSCOPY WITH D & C N/A 01/21/2020   Procedure: DILATATION AND CURETTAGE /HYSTEROSCOPY, POLYPECTOMY;  Surgeon: Benjaman Kindler, MD;  Location: [facility] ORS;  Service: Gynecology;  Laterality: N/A;  Total Deficit 280 Total Fluid 1138 Cutting time 01:11    Past  Gynecologic History:  Menarche: age 55 Post menopausal  History of Abnormal pap:  Last pap: 10/2019 neg/neg   OB History:  G2P2 Vaginal deliveries OB History  Gravida Para Term Preterm AB Living  2 2          SAB TAB Ectopic Multiple Live Births               # Outcome Date GA Lbr Len/2nd Weight Sex Delivery Anes PTL Lv  2 Para           1 Para             Family History: Family History  Problem Relation Age of Onset  . Hypertension Mother   . Diabetes Father   . Breast cancer Neg Hx     Social History: Social History   Socioeconomic History  . Marital status: Married    Spouse name: Not on file  . Number of children: Not on file  . Years of education: Not on file  . Highest education level: Not on file  Occupational History  . Not on file  Tobacco Use  . Smoking status: Former Smoker    Types: Cigarettes    Quit date: 01/28/2019    Years since quitting: 1.0  . Smokeless tobacco: Never Used  Vaping Use  . Vaping Use: Never used  Substance and Sexual Activity  . Alcohol use: Yes    Alcohol/week: 7.0 standard drinks    Types: 7 Glasses of wine per week  . Drug use: Never  . Sexual activity: Yes  Other Topics Concern  . Not on file  Social History Narrative  . Not  on file   Social Determinants of Health   Financial Resource Strain:   . Difficulty of Paying Living Expenses: Not on file  Food Insecurity:   . Worried About Charity fundraiser in the Last Year: Not on file  . Ran Out of Food in the Last Year: Not on file  Transportation Needs:   . Lack of Transportation (Medical): Not on file  . Lack of Transportation (Non-Medical): Not on file  Physical Activity:   . Days of Exercise per Week: Not on file  . Minutes of Exercise per Session: Not on file  Stress:   . Feeling of Stress : Not on file  Social Connections:   . Frequency of Communication with Friends and Family: Not on file  . Frequency of Social Gatherings with Friends and Family: Not on  file  . Attends Religious Services: Not on file  . Active Member of Clubs or Organizations: Not on file  . Attends Archivist Meetings: Not on file  . Marital Status: Not on file  Intimate Partner Violence:   . Fear of Current or Ex-Partner: Not on file  . Emotionally Abused: Not on file  . Physically Abused: Not on file  . Sexually Abused: Not on file    Allergies: Allergies  Allergen Reactions  . Amoxil [Amoxicillin] Other (See Comments)    Unknown/ Childhood  . Penicillins Other (See Comments)    Unknown / Childhood    Current Medications: Current Outpatient Medications  Medication Sig Dispense Refill  . aspirin EC 81 MG tablet Take 81 mg by mouth daily. Swallow whole.    Marland Kitchen CALCIUM CITRATE-VITAMIN D3 PO Take 1 capsule by mouth daily. 630 mg /12.5 mcg    . cholecalciferol (VITAMIN D) 25 MCG (1000 UNIT) tablet Take 1,000 Units by mouth daily.    . Glycerin-Hypromellose-PEG 400 (DRY EYE RELIEF DROPS) 0.2-0.2-1 % SOLN Place 1 drop into both eyes daily. moisturizing eye drops    . loratadine (CLARITIN) 10 MG tablet Take 10 mg by mouth daily.    . Omega 3 1200 MG CAPS Take 1,200 mg by mouth 2 (two) times daily.    . Wheat Dextrin (BENEFIBER PO) Take 1 Dose by mouth daily.     No current facility-administered medications for this visit.    Review of Systems General: negative for fevers, changes in weight or night sweats Skin: negative for changes in moles or sores or rash Eyes: negative for changes in vision HEENT: negative for change in hearing, tinnitus, voice changes Pulmonary: negative for dyspnea, orthopnea, productive cough, wheezing Cardiac: negative for palpitations, pain Gastrointestinal: negative for nausea, vomiting, constipation, diarrhea, hematemesis, hematochezia Genitourinary/Sexual: negative for dysuria, retention, hematuria, incontinence Ob/Gyn:  negative for abnormal bleeding, or pain Musculoskeletal: negative for pain, joint pain, back  pain Hematology: negative for easy bruising, abnormal bleeding Neurologic/Psych: negative for headaches, seizures, paralysis, weakness, numbness  Objective:  Physical Examination:  BP (!) 156/97   Pulse 82   Resp 16   Ht 5' 3.6" (1.615 m)   Wt 152 lb 9.6 oz (69.2 kg)   BMI 26.52 kg/m    ECOG Performance Status: 0 - Asymptomatic  GENERAL: Patient is a well appearing female in no acute distress HEENT:  PERRL, neck supple with midline trachea. Thyroid without masses.  NODES:  No cervical, supraclavicular, axillary, or inguinal lymphadenopathy palpated.  LUNGS:  Clear to auscultation bilaterally.   HEART:  Regular rate and rhythm.  ABDOMEN:  Soft, nontender. No ascites masses or  hernias. MSK:  No focal spinal tenderness to palpation. Full range of motion bilaterally in the upper extremities. EXTREMITIES:  No peripheral edema.   SKIN:  Clear with no obvious rashes or skin changes. No nail dyscrasia. NEURO:  Nonfocal. Well oriented.  Appropriate affect.  Pelvic:chaperoned by nurse  Vulva: normal appearing vulva with no masses, tenderness or lesions; Vagina: normal vagina; Adnexa: normal adnexa in size, nontender and no masses; Uterus: uterus is normal size, shape, consistency and nontender; Cervix: no lesions; Rectal: not indicated    Lab Review Labs on site today: CBC, CMP  Radiologic Imaging: As noted per history    Assessment:  XAVIER FOURNIER is a 64 y.o. female diagnosed with EIN and endometrial stripe ~14 mm.    Medical co-morbidities complicating care: n/a Plan:   Problem List Items Addressed This Visit      Genitourinary   EIN (endometrial intraepithelial neoplasia) - Primary      We discussed options for management including definitive surgery versus hormonal therapy either oral or IUD.  Based on her overall medical status, we recommend definitive surgical evaluation with robotic TH, BSO and washings. Her endometrial stripe is less than 15 mm therefore we will  not proceed with SLN injection, mapping or biopsy. We did discuss possible node dissection if indicated based on operative assessment. We also discussed that postoperative pathology may reveal malignancy and risk of occult cancer is ~29-40%. She agrees to proceed with surgery.  Risks were discussed in detail. These include infection, anesthesia, bleeding, transfusion, wound separation, vaginal cuff dehiscence, medical issues (blood clots, stroke, heart attack, fluid in the lungs, pneumonia, abnormal heart rhythm, death), possible exploratory surgery with larger incision, lymphedema, lymphocyst, allergic reaction, injury to adjacent organs (bowel, bladder, blood vessels, nerves, ureters, uterus). We discussed avoidance of heavy lifting and vaginal intercourse for 3 months after surgery.  We recommended she stop aspirin and omega in anticipation of surgery.   Suggested return to clinic in  4-6 weeks after surgery.   Gyn VTE Prophylaxis Algorithm  Risk factors for VTE (calculator):  Point value Risk factors  1 point each BMI 25-30  2 points each age 29-74  3 points each none in this category  5 points each  none in this category   Major surgery (>30 min); known malignancy or concern for malignancy (elevated tumor markers); minimally invasive surgery.  Risk assessment: 0-4 total points; High risk - heparin/UFH and SCDs (preoperative and continued while in the hospital)  Patient with allergic reaction to penicillin - low severity noted. Per patient she does not remember a reaction and her mother does not either. It sounds like she had a remote reaction during childhood. I discussed with her that we would be using a cephalosporin. According to current guidelines second line prophylaxis should be used in patients with signs of anaphylaxis, urticaria or rash, within 72 hours of administering a -lactam antimicrobial or having had a serious adverse reaction such as, drug fever or toxic epidermal necrolysis.  As this does not appear to be the case the cephalosporin was ordered.   The patient's diagnosis, an outline of the further diagnostic and laboratory studies which will be required, the recommendation, and alternatives were discussed.  All questions were answered to the patient's satisfaction.  A total of 90 minutes were spent with the patient/family today; >50% was spent in education, counseling and coordination of care for EIN.   Ziquan Fidel Gaetana Michaelis, MD    CC:  Benjaman Kindler, MD [phone] [facility]  [place] [place],  [place] [address] [phone]

## 2020-02-09 NOTE — H&P (Addendum)
Gynecologic Oncology History and Physical  Referring Provider: Benjaman Kindler, MD  Chief Concern: EIN  Subjective:  Hannah Burke is a 64 y.o. G2P2 female who is seen in consultation from Dr. Leafy Ro for EIN.  She presented initially with postmenopausal bleeding. Work up included the following: Last pap smear: 10/2019 neg/neg EMBx1/2021: Inactive, benign  SIS 05/2019: polyps and submucosal fibroids  TVUS 05/2019:  Retroverted Ut=5.24 x 3.53 x 4.45 cm Endometrium=14.34 mm Polyps seen,1=0.62 x 0.43 x 0.76 cm, 2=0.76 x 0.85 x 1.06 cm Neither ov seen,NoAdnexal masses seen  On 01/21/2020 she underwent exam under anesthesia, fractional D&C, hysteroscopy, and Myosure polypectomy/myomectomy. Uterus sounded to 7.5 cm and findings demonstrated two fundal polyps, two possible fibroid vs polyp in the posterior lower uterine segment, normal tubal ostia.  Her final pathology is noted below.    DIAGNOSIS:  A. ENDOCERVIX, CURETTAGE:  - BENIGN ENDOCERVICAL AND SQUAMOUS MUCOSA.  - NEGATIVE FOR DYSPLASIA AND MALIGNANCY.   B. ENDOMETRIUM, CURETTAGE:  - SCANT, SUPERFICIAL FRAGMENTS OF EIN / ATYPICAL HYPERPLASIA, SIMILAR TO  THAT SEEN IN PART B.   C. 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    Past Medical History: Past Medical History:  Diagnosis Date   Allergy    Cancer ([place])    basal cell    Past Surgical History: Past Surgical History:  Procedure Laterality Date   HYSTEROSCOPY WITH D & C N/A 01/21/2020   Procedure: DILATATION AND CURETTAGE /HYSTEROSCOPY, POLYPECTOMY;  Surgeon: Benjaman Kindler, MD;  Location: [facility] ORS;  Service: Gynecology;  Laterality: N/A;  Total Deficit 280 Total Fluid 1138 Cutting time 01:11     Past Gynecologic History:  Menarche: age 24 Post menopausal  History of Abnormal pap:  Last pap: 10/2019 neg/neg   OB History:  G2P2 Vaginal deliveries OB History  Gravida Para Term Preterm AB Living  2 2          SAB TAB Ectopic Multiple Live Births               # Outcome Date GA Lbr Len/2nd Weight Sex Delivery Anes PTL Lv  2 Para           1 Para             Family History: Family History  Problem Relation Age of Onset   Hypertension Mother    Diabetes Father    Breast cancer Neg Hx     Social History: Social History   Socioeconomic History   Marital status: Married    Spouse name: Not on file   Number of children: Not on file   Years of education: Not on file   Highest education level: Not on file  Occupational History   Not on file  Tobacco Use   Smoking status: Former Smoker    Types: Cigarettes    Quit date: 01/28/2019    Years since quitting: 1.0   Smokeless tobacco: Never Used  Vaping Use   Vaping Use: Never used  Substance and Sexual Activity   Alcohol use: Yes    Alcohol/week: 7.0 standard drinks    Types: 7 Glasses of wine per week   Drug use: Never   Sexual activity: Yes  Other Topics Concern   Not on file  Social History Narrative   Not  on file   Social Determinants of Health   Financial Resource Strain:    Difficulty of Paying Living Expenses: Not on file  Food Insecurity:    Worried About [place] in the Last Year: Not on file   Ran Out of Food in the Last Year: Not on file  Transportation Needs:    Lack of Transportation (Medical): Not on file   Lack of Transportation (Non-Medical): Not on file  Physical Activity:    Days of Exercise per Week: Not on file   Minutes of Exercise per Session: Not on file  Stress:    Feeling of Stress : Not on file  Social Connections:    Frequency of Communication with Friends and Family: Not on file   Frequency of Social Gatherings with Friends and  Family: Not on file   Attends Religious Services: Not on file   Active Member of Clubs or Organizations: Not on file   Attends Archivist Meetings: Not on file   Marital Status: Not on file  Intimate Partner Violence:    Fear of Current or Ex-Partner: Not on file   Emotionally Abused: Not on file   Physically Abused: Not on file   Sexually Abused: Not on file    Allergies: Allergies  Allergen Reactions   Amoxil [Amoxicillin] Other (See Comments)    Unknown/ Childhood   Penicillins Other (See Comments)    Unknown / Childhood    Current Medications: Current Outpatient Medications  Medication Sig Dispense Refill   aspirin EC 81 MG tablet Take 81 mg by mouth daily. Swallow whole.     CALCIUM CITRATE-VITAMIN D3 PO Take 1 capsule by mouth daily. 630 mg /12.5 mcg     cholecalciferol (VITAMIN D) 25 MCG (1000 UNIT) tablet Take 1,000 Units by mouth daily.     Glycerin-Hypromellose-PEG 400 (DRY EYE RELIEF DROPS) 0.2-0.2-1 % SOLN Place 1 drop into both eyes daily. moisturizing eye drops     loratadine (CLARITIN) 10 MG tablet Take 10 mg by mouth daily.     Omega 3 1200 MG CAPS Take 1,200 mg by mouth 2 (two) times daily.     Wheat Dextrin (BENEFIBER PO) Take 1 Dose by mouth daily.     No current facility-administered medications for this visit.    Review of Systems General: negative for fevers, changes in weight or night sweats Skin: negative for changes in moles or sores or rash Eyes: negative for changes in vision HEENT: negative for change in hearing, tinnitus, voice changes Pulmonary: negative for dyspnea, orthopnea, productive cough, wheezing Cardiac: negative for palpitations, pain Gastrointestinal: negative for nausea, vomiting, constipation, diarrhea, hematemesis, hematochezia Genitourinary/Sexual: negative for dysuria, retention, hematuria, incontinence Ob/Gyn:  negative for abnormal bleeding, or pain Musculoskeletal: negative for pain, joint pain,  back pain Hematology: negative for easy bruising, abnormal bleeding Neurologic/Psych: negative for headaches, seizures, paralysis, weakness, numbness  Objective:  Physical Examination:  BP (!) 156/97    Pulse 82    Resp 16    Ht 5' 3.6" (1.615 m)    Wt 152 lb 9.6 oz (69.2 kg)    BMI 26.52 kg/m    ECOG Performance Status: 0 - Asymptomatic  GENERAL: Patient is a well appearing female in no acute distress HEENT:  PERRL, neck supple with midline trachea. Thyroid without masses.  NODES:  No cervical, supraclavicular, axillary, or inguinal lymphadenopathy palpated.  LUNGS:  Clear to auscultation bilaterally.   HEART:  Regular rate and rhythm.  ABDOMEN:  Soft,  nontender. No ascites masses or hernias. MSK:  No focal spinal tenderness to palpation. Full range of motion bilaterally in the upper extremities. EXTREMITIES:  No peripheral edema.   SKIN:  Clear with no obvious rashes or skin changes. No nail dyscrasia. NEURO:  Nonfocal. Well oriented.  Appropriate affect.  Pelvic:chaperoned by nurse  Vulva: normal appearing vulva with no masses, tenderness or lesions; Vagina: normal vagina; Adnexa: normal adnexa in size, nontender and no masses; Uterus: uterus is normal size, shape, consistency and nontender; Cervix: no lesions; Rectal: not indicated    Lab Review Labs on site today: CBC, CMP  Radiologic Imaging: As noted per history    Assessment:  ZURII HEWES is a 64 y.o. female diagnosed with EIN and endometrial stripe ~14 mm.    Medical co-morbidities complicating care: n/a Plan:   Problem List Items Addressed This Visit      Genitourinary   EIN (endometrial intraepithelial neoplasia) - Primary      We discussed options for management including definitive surgery versus hormonal therapy either oral or IUD.  Based on her overall medical status, we recommend definitive surgical evaluation with robotic TH, BSO and washings. Her endometrial stripe is less than 15 mm therefore we  will not proceed with SLN injection, mapping or biopsy. We did discuss possible node dissection if indicated based on operative assessment. We also discussed that postoperative pathology may reveal malignancy and risk of occult cancer is ~29-40%. She agrees to proceed with surgery.  Risks were discussed in detail. These include infection, anesthesia, bleeding, transfusion, wound separation, vaginal cuff dehiscence, medical issues (blood clots, stroke, heart attack, fluid in the lungs, pneumonia, abnormal heart rhythm, death), possible exploratory surgery with larger incision, lymphedema, lymphocyst, allergic reaction, injury to adjacent organs (bowel, bladder, blood vessels, nerves, ureters, uterus). We discussed avoidance of heavy lifting and vaginal intercourse for 3 months after surgery.  We recommended she stop aspirin and omega in anticipation of surgery.   Suggested return to clinic in  4-6 weeks after surgery.   Gyn VTE Prophylaxis Algorithm  Risk factors for VTE (calculator):  Point value Risk factors  1 point each BMI 25-30  2 points each age 48-74  3 points each none in this category  5 points each  none in this category   Major surgery (>30 min); known malignancy or concern for malignancy (elevated tumor markers); minimally invasive surgery.  Risk assessment: 0-4 total points; High risk - heparin/UFH and SCDs (preoperative and continued while in the hospital)  Patient with allergic reaction to penicillin - low severity noted. Per patient she does not remember a reaction and her mother does not either. It sounds like she had a remote reaction during childhood. I discussed with her that we would be using a cephalosporin. According to current guidelines second line prophylaxis should be used in patients with signs of anaphylaxis, urticaria or rash, within 72 hours of administering a -lactam antimicrobial or having had a serious adverse reaction such as, drug fever or toxic epidermal  necrolysis. As this does not appear to be the case the cephalosporin was ordered.   The patient's diagnosis, an outline of the further diagnostic and laboratory studies which will be required, the recommendation, and alternatives were discussed.  All questions were answered to the patient's satisfaction.  A total of 90 minutes were spent with the patient/family today; >50% was spent in education, counseling and coordination of care for EIN.   Jerae Izard Gaetana Michaelis, MD    CC:  Benjaman Kindler, [address] [place],  [place] [address] [phone]

## 2020-02-09 NOTE — Progress Notes (Signed)
Pre and post operative teaching completed. Provided contact information for any future needs. Provided printed teaching in AVS.

## 2020-02-09 NOTE — H&P (View-Only) (Signed)
Gynecologic Oncology History and Physical  Referring Provider: Benjaman Kindler, MD  Chief Concern: EIN  Subjective:  Hannah Burke is a 64 y.o. G2P2 female who is seen in consultation from Dr. Leafy Ro for EIN.  She presented initially with postmenopausal bleeding. Work up included the following: Last pap smear: 10/2019 neg/neg EMBx1/2021: Inactive, benign  SIS 05/2019: polyps and submucosal fibroids  TVUS 05/2019:  Retroverted Ut=5.24 x 3.53 x 4.45 cm Endometrium=14.34 mm Polyps seen,1=0.62 x 0.43 x 0.76 cm, 2=0.76 x 0.85 x 1.06 cm Neither ov seen,NoAdnexal masses seen  On 01/21/2020 she underwent exam under anesthesia, fractional D&C, hysteroscopy, and Myosure polypectomy/myomectomy. Uterus sounded to 7.5 cm and findings demonstrated two fundal polyps, two possible fibroid vs polyp in the posterior lower uterine segment, normal tubal ostia.  Her final pathology is noted below.    DIAGNOSIS:  A. ENDOCERVIX, CURETTAGE:  - BENIGN ENDOCERVICAL AND SQUAMOUS MUCOSA.  - NEGATIVE FOR DYSPLASIA AND MALIGNANCY.   B. ENDOMETRIUM, CURETTAGE:  - SCANT, SUPERFICIAL FRAGMENTS OF EIN / ATYPICAL HYPERPLASIA, SIMILAR TO  THAT SEEN IN PART B.   C. 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   Problem List: Patient Active Problem List   Diagnosis Date Noted  . 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 Allergy   . Cancer ([place])    basal cell    Past Surgical History: Past Surgical History:  Procedure Laterality Date  . HYSTEROSCOPY WITH D & C N/A 01/21/2020   Procedure: DILATATION AND CURETTAGE /HYSTEROSCOPY, POLYPECTOMY;  Surgeon: Benjaman Kindler, MD;  Location: [facility] ORS;  Service: Gynecology;  Laterality: N/A;  Total Deficit 280 Total Fluid 1138 Cutting time 01:11     Past Gynecologic History:  Menarche: age 17 Post menopausal  History of Abnormal pap:  Last pap: 10/2019 neg/neg   OB History:  G2P2 Vaginal deliveries OB History  Gravida Para Term Preterm AB Living  2 2          SAB TAB Ectopic Multiple Live Births               # Outcome Date GA Lbr Len/2nd Weight Sex Delivery Anes PTL Lv  2 Para           1 Para             Family History: Family History  Problem Relation Age of Onset  . Hypertension Mother   . Diabetes Father   . Breast cancer Neg Hx     Social History: Social History   Socioeconomic History  . Marital status: Married    Spouse name: Not on file  . Number of children: Not on file  . Years of education: Not on file  . Highest education level: Not on file  Occupational History  . Not on file  Tobacco Use  . Smoking status: Former Smoker    Types: Cigarettes    Quit date: 01/28/2019    Years since quitting: 1.0  . Smokeless tobacco: Never Used  Vaping Use  . Vaping Use: Never used  Substance and Sexual Activity  . Alcohol use: Yes    Alcohol/week: 7.0 standard drinks    Types: 7 Glasses of wine per week  . Drug use: Never  . Sexual activity: Yes  Other Topics Concern  . Not on file  Social History Narrative  . Not  on file   Social Determinants of Health   Financial Resource Strain:   . Difficulty of Paying Living Expenses: Not on file  Food Insecurity:   . Worried About Charity fundraiser in the Last Year: Not on file  . Ran Out of Food in the Last Year: Not on file  Transportation Needs:   . Lack of Transportation (Medical): Not on file  . Lack of Transportation (Non-Medical): Not on file  Physical Activity:   . Days of Exercise per Week: Not on file  . Minutes of Exercise per Session: Not on file  Stress:   . Feeling of Stress : Not on file  Social Connections:   . Frequency of Communication with Friends and Family: Not on file  . Frequency of Social Gatherings with Friends and  Family: Not on file  . Attends Religious Services: Not on file  . Active Member of Clubs or Organizations: Not on file  . Attends Archivist Meetings: Not on file  . Marital Status: Not on file  Intimate Partner Violence:   . Fear of Current or Ex-Partner: Not on file  . Emotionally Abused: Not on file  . Physically Abused: Not on file  . Sexually Abused: Not on file    Allergies: Allergies  Allergen Reactions  . Amoxil [Amoxicillin] Other (See Comments)    Unknown/ Childhood  . Penicillins Other (See Comments)    Unknown / Childhood    Current Medications: Current Outpatient Medications  Medication Sig Dispense Refill  . aspirin EC 81 MG tablet Take 81 mg by mouth daily. Swallow whole.    Marland Kitchen CALCIUM CITRATE-VITAMIN D3 PO Take 1 capsule by mouth daily. 630 mg /12.5 mcg    . cholecalciferol (VITAMIN D) 25 MCG (1000 UNIT) tablet Take 1,000 Units by mouth daily.    . Glycerin-Hypromellose-PEG 400 (DRY EYE RELIEF DROPS) 0.2-0.2-1 % SOLN Place 1 drop into both eyes daily. moisturizing eye drops    . loratadine (CLARITIN) 10 MG tablet Take 10 mg by mouth daily.    . Omega 3 1200 MG CAPS Take 1,200 mg by mouth 2 (two) times daily.    . Wheat Dextrin (BENEFIBER PO) Take 1 Dose by mouth daily.     No current facility-administered medications for this visit.    Review of Systems General: negative for fevers, changes in weight or night sweats Skin: negative for changes in moles or sores or rash Eyes: negative for changes in vision HEENT: negative for change in hearing, tinnitus, voice changes Pulmonary: negative for dyspnea, orthopnea, productive cough, wheezing Cardiac: negative for palpitations, pain Gastrointestinal: negative for nausea, vomiting, constipation, diarrhea, hematemesis, hematochezia Genitourinary/Sexual: negative for dysuria, retention, hematuria, incontinence Ob/Gyn:  negative for abnormal bleeding, or pain Musculoskeletal: negative for pain, joint pain,  back pain Hematology: negative for easy bruising, abnormal bleeding Neurologic/Psych: negative for headaches, seizures, paralysis, weakness, numbness  Objective:  Physical Examination:  BP (!) 156/97   Pulse 82   Resp 16   Ht 5' 3.6" (1.615 m)   Wt 152 lb 9.6 oz (69.2 kg)   BMI 26.52 kg/m    ECOG Performance Status: 0 - Asymptomatic  GENERAL: Patient is a well appearing female in no acute distress HEENT:  PERRL, neck supple with midline trachea. Thyroid without masses.  NODES:  No cervical, supraclavicular, axillary, or inguinal lymphadenopathy palpated.  LUNGS:  Clear to auscultation bilaterally.   HEART:  Regular rate and rhythm.  ABDOMEN:  Soft, nontender. No ascites masses or  hernias. MSK:  No focal spinal tenderness to palpation. Full range of motion bilaterally in the upper extremities. EXTREMITIES:  No peripheral edema.   SKIN:  Clear with no obvious rashes or skin changes. No nail dyscrasia. NEURO:  Nonfocal. Well oriented.  Appropriate affect.  Pelvic:chaperoned by nurse  Vulva: normal appearing vulva with no masses, tenderness or lesions; Vagina: normal vagina; Adnexa: normal adnexa in size, nontender and no masses; Uterus: uterus is normal size, shape, consistency and nontender; Cervix: no lesions; Rectal: not indicated    Lab Review Labs on site today: CBC, CMP  Radiologic Imaging: As noted per history    Assessment:  Hannah Burke is a 64 y.o. female diagnosed with EIN and endometrial stripe ~14 mm.    Medical co-morbidities complicating care: n/a Plan:   Problem List Items Addressed This Visit      Genitourinary   EIN (endometrial intraepithelial neoplasia) - Primary      We discussed options for management including definitive surgery versus hormonal therapy either oral or IUD.  Based on her overall medical status, we recommend definitive surgical evaluation with robotic TH, BSO and washings. Her endometrial stripe is less than 15 mm therefore we  will not proceed with SLN injection, mapping or biopsy. We did discuss possible node dissection if indicated based on operative assessment. We also discussed that postoperative pathology may reveal malignancy and risk of occult cancer is ~29-40%. She agrees to proceed with surgery.  Risks were discussed in detail. These include infection, anesthesia, bleeding, transfusion, wound separation, vaginal cuff dehiscence, medical issues (blood clots, stroke, heart attack, fluid in the lungs, pneumonia, abnormal heart rhythm, death), possible exploratory surgery with larger incision, lymphedema, lymphocyst, allergic reaction, injury to adjacent organs (bowel, bladder, blood vessels, nerves, ureters, uterus). We discussed avoidance of heavy lifting and vaginal intercourse for 3 months after surgery.  We recommended she stop aspirin and omega in anticipation of surgery.   Suggested return to clinic in  4-6 weeks after surgery.   Gyn VTE Prophylaxis Algorithm  Risk factors for VTE (calculator):  Point value Risk factors  1 point each BMI 25-30  2 points each age 86-74  3 points each none in this category  5 points each  none in this category   Major surgery (>30 min); known malignancy or concern for malignancy (elevated tumor markers); minimally invasive surgery.  Risk assessment: 0-4 total points; High risk - heparin/UFH and SCDs (preoperative and continued while in the hospital)  Patient with allergic reaction to penicillin - low severity noted. Per patient she does not remember a reaction and her mother does not either. It sounds like she had a remote reaction during childhood. I discussed with her that we would be using a cephalosporin. According to current guidelines second line prophylaxis should be used in patients with signs of anaphylaxis, urticaria or rash, within 72 hours of administering a -lactam antimicrobial or having had a serious adverse reaction such as, drug fever or toxic epidermal  necrolysis. As this does not appear to be the case the cephalosporin was ordered.   The patient's diagnosis, an outline of the further diagnostic and laboratory studies which will be required, the recommendation, and alternatives were discussed.  All questions were answered to the patient's satisfaction.  A total of 90 minutes were spent with the patient/family today; >50% was spent in education, counseling and coordination of care for EIN.   Maite Burlison Gaetana Michaelis, MD    CC:  Benjaman Kindler, MD [phone] [facility]  [address] [place],  [place] [address] [phone]

## 2020-02-09 NOTE — Progress Notes (Signed)
Pt here as new pt for EIN. Pt not currently bleeding. No pain at this time

## 2020-02-09 NOTE — Patient Instructions (Signed)
In Preparation of your upcoming procedure you are having testing for Covid-19 on Monday, February 28, 2020 at the [facility]. This is a drive up testing site. Please come between 8:00am-1:00pm.  We are asking that you stay at home and avoid visitors from this point until after your procedure is completed to minimize potential for Covid-19 exposure.    Please Wash your hands frequently with soap and water or clean hands with an alcohol-based hand sanitizer often.  Do not touch your eyes, nose, and mouth, especially with unwashed hands.  Should you at any time develop new symptoms of fever, cough, sneezing or runny nose, sore throat, difficulty breathing, or unexplained body aches, we ask that you contact your provider.   It may take up to 48 hours for results of this testing to be made available.   You will not receive notification if test results are negative.  If test results are positive for Covid-19, your provider or his/her representative will notify you by phone, with additional instructions.           DIVISION OF GYNECOLOGIC ONCOLOGY BOWEL PREP   The following instructions are extremely important to prepare for your surgery. Please follow them carefully   Step 1: Liquid Diet Instructions              Clear Liquid Diet for GYN Oncology Patients Day Before Surgery The day before your scheduled surgery DO NOT EAT any solid foods.  We do want you to drink enough liquids, but NO MILK products.  We do not want you to be dehydrated.  Clear liquids are defined as no milk products and no pieces of any solid food. Drink at least 64 oz. of fluid.  The following are all approved for you to drink the day before you surgery.  Chicken, Beef or Vegetable Broth (bouillon or consomm) - NO BROTH AFTER MIDNIGHT  Plain Jello  (no fruit)  Water  Strained lemonade or fruit punch  Gatorade (any flavor)  CLEAR Ensure or Boost Breeze  Fruit juices without pulp, such as apple,  grape, or cranberry juice  Clear sodas - NO SODA AFTER MIDNIGHT  Ice Pops without bits of fruit or fruit pulp  Honey  Tea or coffee without milk or cream                 Any foods not on the above list should be avoided                                                                                             Step 2: Laxatives           The evening before surgery:   Time: around 5pm   Follow these instructions carefully.   Administer 1 Dulcolax suppository according to manufacturer instructions on the box. You will need to purchase this laxative at a pharmacy or grocery store.    Individual responses to laxatives vary; this prep may cause multiple bowel movements. It often works in 30 minutes and may take as long as 3 hours. Stay near an available bathroom.    It  is important to stay hydrated. Ensure you are still drinking clear liquids.     IMPORTANT: FOR YOUR SAFETY, WE WILL HAVE TO CANCEL YOUR SURGERY IF YOU DO NOT FOLLOW THESE INSTRUCTIONS.    Do not eat anything after midnight (including gum or candy) prior to your surgery.  Avoid drinking carbonated beverages after midnight.  You can have clear liquids up until one hour before you arrive at the hospital. "Nothing by mouth" means no liquids, gum, candy, etc for one hour before your arrival time.      Laparoscopy Laparoscopy is a procedure to diagnose diseases in the abdomen. During the procedure, a thin, lighted, pencil-sized instrument called a laparoscope is inserted into the abdomen through an incision. The laparoscope allows your health care provider to look at the organs inside your body. LET [facility] CARE PROVIDER KNOW ABOUT:  Any allergies you have.  All medicines you are taking, including vitamins, herbs, eye drops, creams, and over-the-counter medicines.  Previous problems you or members of your family have had with the use of anesthetics.  Any blood disorders you have.  Previous surgeries you have  had.  Medical conditions you have. RISKS AND COMPLICATIONS  Generally, this is a safe procedure. However, problems can occur, which may include:  Infection.  Bleeding.  Damage to other organs.  Allergic reaction to the anesthetics used during the procedure. BEFORE THE PROCEDURE  Do not eat or drink anything after midnight on the night before the procedure or as directed by your health care provider.  Ask your health care provider about: ? Changing or stopping your regular medicines. ? Taking medicines such as aspirin and ibuprofen. These medicines can thin your blood. Do not take these medicines before your procedure if your health care provider instructs you not to.  Plan to have someone take you home after the procedure. PROCEDURE  You may be given a medicine to help you relax (sedative).  You will be given a medicine to make you sleep (general anesthetic).  Your abdomen will be inflated with a gas. This will make your organs easier to see.  Small incisions will be made in your abdomen.  A laparoscope and other small instruments will be inserted into the abdomen through the incisions.  A tissue sample may be removed from an organ in the abdomen for examination.  The instruments will be removed from the abdomen.  The gas will be released.  The incisions will be closed with stitches (sutures). AFTER THE PROCEDURE  Your blood pressure, heart rate, breathing rate, and blood oxygen level will be monitored often until the medicines you were given have worn off.   This information is not intended to replace advice given to you by your health care provider. Make sure you discuss any questions you have with your health care provider.                                             Bowel Symptoms After Surgery After gynecologic surgery, women often have temporary changes in bowel function (constipation and gas pain).  Following are tips to help prevent and treat common bowel  problems.  It also tells you when to call the doctor.  This is important because some symptoms might be a sign of a more serious bowel problem such as obstruction (bowel blockage).  These problems are rare but can  happen after gynecologic surgery.   Besides surgery, what can temporarily affect bowel function? 1. Dietary changes   2. Decreased physical activity   3.Antibiotics   4. Pain medication   How can I prevent constipation (three days or more without a stool)? 1. Include fiber in your diet: whole grains, raw or dried fruits & vegetables, prunes, prune/pear juiceDrink at least 8 glasses of liquid (preferably water) every day 2. Avoid: ? Gas forming foods such as broccoli, beans, peas, salads, cabbage, sweet potatoes ? Greasy, fatty, or fried foods 3. Activity helps bowel function return to normal, walk around the house at least 3-4 times each day for 15 minutes or longer, if tolerated.  Rocking in a rocking chair is preferable to sitting still. 4. Stool softeners: these are not laxatives, but serve to soften the stool to avoid straining.  Take 2-4 times a day until normal bowel function returns         Examples: Colace or generic equivalent (Docusate) 5. Bulk laxatives: provide a concentrated source of fiber.  They do not stimulate the bowel.  Take 1-2 times each day until normal bowel function return.              Examples: Citrucel, Metamucil, Fiberal, Fibercon   What can I take for "Gas Pains"? 1. Simethicone (Mylicon, Gas-X, Maalox-Gas, Mylanta-Gas) take 3-4 times a day 2. Maalox Regular - take 3-4 times a day 3. Mylanta Regular - take 3-4 times a day   What can I take if I become constipated? 1. Start with stool softeners and add additional laxatives below as needed to have a bowel movement every 1-2 days  2. Stool softeners 1-2 tablets, 2 times a day 3. Senokot 1-2 tablets, 1-2 times a day 4. Glycerin suppository can soften hard stool take once a day 5. Bisacodyl suppository once  a day  6. Milk of Magnesia 30 mL 1-2 times a day 7. Fleets or tap water enema    What can I do for nausea?  1. Limit most solid foods for 24-48 hours 2. Continue eating small frequent amounts of liquids and/or bland soft foods ? Toast, crackers, cooked cereal (grits, cream of wheat, rice) 3. Benadryl: a mild anti-nausea medicine can be obtained without a prescription. May cause drowsiness, especially if taken with narcotic pain medicines 4. Contact provider for prescription nausea medication     What can I do, or take for diarrhea (more than five loose stools per day)? 1. Drink plenty of clear fluids to prevent dehydration 2. May take Kaopectate, Pepto-Bismol, Imodium, or probiotics for 1-2 days 3. Anusol or Preparation-H can be helpful for hemorrhoids and irritated tissue around anus   When should I call the doctor?             CONSTIPATION:   Not relieved after three days following the above program VOMITING:  That contains blood, "coffee ground" material  More the three times/hour and unable to keep down nausea medication for more than eight hours  With dry mouth, dark or strong urine, feeling light-headed, dizzy, or confused  With severe abdominal pain or bloating for more than 24 hours DIARRHEA:  That continues for more then 24-48 hours despite treatment  That contains blood or tarry material  With dry mouth, dark or strong urine, feeling light~headed, dizzy, or confused FEVER:  101 F or higher along with nausea, vomiting, gas pain, diarrhea UNABLE TO:  Pass gas from rectum for more than 24 hours  Tolerate liquids by  mouth for more than 24 hours        Laparoscopic Hysterectomy, Care After Refer to this sheet in the next few weeks. These instructions provide you with information on caring for yourself after your procedure. Your health care provider may also give you more specific instructions. Your treatment has been planned according to current medical  practices, but problems sometimes occur. Call your health care provider if you have any problems or questions after your procedure. What can I expect after the procedure?  Pain and bruising at the incision sites. You will be given pain medicine to control it.  Menopausal symptoms such as hot flashes, night sweats, and insomnia if your ovaries were removed.  Sore throat from the breathing tube that was inserted during surgery. Follow these instructions at home:  Only take over-the-counter or prescription medicines for pain, discomfort, or fever as directed by your health care provider.  Do not take aspirin. It can cause bleeding.  Do not drive when taking pain medicine.  Follow your health care provider's advice regarding diet, exercise, lifting, driving, and general activities.  Resume your usual diet as directed and allowed.  Get plenty of rest and sleep.  Do not douche, use tampons, or have sexual intercourse for at least 6 weeks, or until your health care provider gives you permission.  Change your bandages (dressings) as directed by your health care provider.  Monitor your temperature and notify your health care provider of a fever.  Take showers instead of baths for 2-3 weeks.  Do not drink alcohol until your health care provider gives you permission.  If you develop constipation, you may take a mild laxative with your health care provider's permission. Bran foods may help with constipation problems. Drinking enough fluids to keep your urine clear or pale yellow may help as well.  Try to have someone home with you for 1-2 weeks to help around the house.  Keep all of your follow-up appointments as directed by your health care provider. Contact a health care provider if:  You have swelling, redness, or increasing pain around your incision sites.  You have pus coming from your incision.  You notice a bad smell coming from your incision.  Your incision breaks  open.  You feel dizzy or lightheaded.  You have pain or bleeding when you urinate.  You have persistent diarrhea.  You have persistent nausea and vomiting.  You have abnormal vaginal discharge.  You have a rash.  You have any type of abnormal reaction or develop an allergy to your medicine.  You have poor pain control with your prescribed medicine. Get help right away if:  You have chest pain or shortness of breath.  You have severe abdominal pain that is not relieved with pain medicine.  You have pain or swelling in your legs. This information is not intended to replace advice given to you by your health care provider. Make sure you discuss any questions you have with your health care provider. Document Released: 02/03/2013 Document Revised: 09/21/2015 Document Reviewed: 11/03/2012 Elsevier Interactive Patient Education  2017 [employer].

## 2020-02-21 ENCOUNTER — Other Ambulatory Visit: Payer: Self-pay

## 2020-02-21 ENCOUNTER — Encounter
Admission: RE | Admit: 2020-02-21 | Discharge: 2020-02-21 | Disposition: A | Discharge status: Home / Self Care | Payer: Medicare Other | Source: Ambulatory Visit | Attending: Obstetrics and Gynecology | Admitting: Obstetrics and Gynecology

## 2020-02-21 DIAGNOSIS — Z01812 Encounter for preprocedural laboratory examination: Secondary | ICD-10-CM | POA: Insufficient documentation

## 2020-02-21 HISTORY — DX: Postmenopausal bleeding: N95.0

## 2020-02-21 NOTE — Patient Instructions (Addendum)
INSTRUCTIONS FOR SURGERY     Your surgery is scheduled for:   Wednesday, November 3RD     To find out your arrival time for the day of surgery,          please call [phone] between 1 pm and 3 pm on :  Tuesday, November 2ND     When you arrive for surgery, report to the [facility].       Do NOT stop on the first floor to register.    REMEMBER: Instructions that are not followed completely may result in serious medical risk,  up to and including death, or upon the discretion of your surgeon and anesthesiologist,            your surgery may need to be rescheduled.  __X__ 1. Do not eat food after midnight the night before your procedure.                    No gum, candy, lozenger, tic tacs, tums or hard candies.                  ABSOLUTELY NOTHING SOLID IN YOUR MOUTH AFTER MIDNIGHT                    You may drink unlimited clear liquids up to 2 hours before you are scheduled to arrive for surgery.                   Do not drink anything within those 2 hours unless you need to take medicine, then take the                   smallest amount you need.  Clear liquids include:  water, apple juice without pulp,                   any flavor Gatorade, Black coffee, black tea.  Sugar may be added but no dairy/ honey /lemon.                        Broth and jello is not considered a clear liquid.  __x__  2. On the morning of surgery, please brush your teeth with toothpaste and water. You may rinse with                  mouthwash if you wish but DO NOT SWALLOW TOOTHPASTE OR MOUTHWASH  __X___3. NO alcohol for 24 hours before or after surgery.  __x___ 4.  Do NOT smoke or use e-cigarettes for 24 HOURS PRIOR TO SURGERY.                      DO NOT Use any chewable tobacco products for at least 6 hours prior to surgery.  __x___ 5. If you start any new medication after this appointment and prior to surgery, please                    Bring it with you on the day of surgery.  ___x__ 6. Notify your doctor if there is any change in  your medical condition, such as fever,                   nfection, vomitting, diarrhea or any open sores.  __x___ 7.  USE the CHG SOAP as instructed, the night before surgery and the day of surgery.                   Once you have washed with this soap, do NOT use any of the following: Powders, perfumes                    or lotions. Please do not wear make up, hairpins, clips or nail polish. You MAY wear deodorant.                   Men may shave their face and neck.  Women need to shave 48 hours prior to surgery.                   DO NOT wear ANY jewelry on the day of surgery. If there are rings that are too tight to                    remove easily, please address this prior to the surgery day. Piercings need to be removed.                                                                     NO METAL ON YOUR BODY.                    Do NOT bring any valuables.  If you came to Pre-Admit testing then you will not need license,                     insurance card or credit card.  If you will be staying overnight, please either leave your things in                     the car or have your family be responsible for these items.                     [facility] IS NOT RESPONSIBLE FOR BELONGINGS OR VALUABLES.  ___X__ 8. DO NOT wear contact lenses on surgery day.  You may not have dentures,                     Hearing aides, contacts or glasses in the operating room. These items can be                    Placed in the Recovery Room to receive immediately after surgery.  __x___ 9. IF YOU ARE SCHEDULED TO GO HOME ON THE SAME DAY, YOU MUST                   Have someone to drive you home and to stay with you  for the first 24 hours.                    Have an arrangement prior to arriving on surgery day.  ___x__ 10. Take the following medications on  the morning of surgery with a sip of water:                               1.CLARITIN                     2.DRY EYE RELIEF EYE DROPS                     3.                     __X___ 11.  Follow any instructions provided to you by your surgeon.                        Such as enema, clear liquid bowel prep                     PLEASE DRINK THE PRESURGICAL CARBOHYDRATE DRINK ON MORNING                      OF SURGERY. COMPLETE IT 2 HOURS PRIOR TO ARRIVAL TO [facility].  __X__  12. STOP ALL ASPIRIN PRODUCTS ONE WEEK PRIOR TO SURGERY, 02/23/2020                       THIS INCLUDES BC POWDERS / GOODIES POWDER  __x___ 13. STOP Anti-inflammatories as of: ONE WEEK PRIOR TO SURGERY, 02/23/20                      This includes IBUPROFEN / MOTRIN / ADVIL / ALEVE/ NAPROXYN                    YOU MAY TAKE TYLENOL ANY TIME PRIOR TO SURGERY.  _X____ 14.  Stop supplements until after surgery.                     This includes: CALCIUM CITRATE // [place] [facility]                 You may continue taking Vitamin B12 / Vitamin D3 but do not take on the morning of surgery.  ___X___17.  Continue to take the following medications but do not take on the morning of surgery:                        ASPIRIN // BENEFIBER  __X____18. Wear clean and comfortable clothing to the hospital.                   MAKE SURE YOUR CLOTHING IS LOOSE AROUND YOUR ABDOMEN.

## 2020-02-28 ENCOUNTER — Other Ambulatory Visit: Payer: Self-pay

## 2020-02-28 ENCOUNTER — Other Ambulatory Visit
Admission: RE | Admit: 2020-02-28 | Discharge: 2020-02-28 | Disposition: A | Discharge status: Home / Self Care | Payer: Medicare Other | Source: Ambulatory Visit | Attending: Obstetrics and Gynecology | Admitting: Obstetrics and Gynecology

## 2020-02-28 DIAGNOSIS — Z20822 Contact with and (suspected) exposure to covid-19: Secondary | ICD-10-CM | POA: Diagnosis not present

## 2020-02-28 DIAGNOSIS — Z01818 Encounter for other preprocedural examination: Secondary | ICD-10-CM | POA: Diagnosis present

## 2020-02-28 LAB — TYPE AND SCREEN
ABO/RH(D): O NEG
Antibody Screen: NEGATIVE

## 2020-02-29 LAB — SARS CORONAVIRUS 2 (TAT 6-24 HRS): SARS Coronavirus 2: NEGATIVE

## 2020-02-29 MED ORDER — HEPARIN SODIUM (PORCINE) 5000 UNIT/ML IJ SOLN: 5000.0000 [IU] | INTRAMUSCULAR | Status: AC

## 2020-02-29 MED ORDER — FAMOTIDINE 20 MG PO TABS: 20.0000 mg | ORAL_TABLET | Freq: Once | ORAL | Status: AC

## 2020-02-29 MED ORDER — LACTATED RINGERS IV SOLN: INTRAVENOUS | Status: DC

## 2020-02-29 MED ORDER — CEFAZOLIN SODIUM-DEXTROSE 2-4 GM/100ML-% IV SOLN
2.0000 g | INTRAVENOUS | Status: AC
  Administered 2020-03-01: 2 g via INTRAVENOUS

## 2020-02-29 MED ORDER — SOD CITRATE-CITRIC ACID 500-334 MG/5ML PO SOLN
30.0000 mL | ORAL | Status: DC
  Filled 2020-02-29: qty 30

## 2020-02-29 MED ORDER — CHLORHEXIDINE GLUCONATE 0.12 % MT SOLN: 15.0000 mL | Freq: Once | OROMUCOSAL | Status: AC

## 2020-02-29 MED ORDER — ORAL CARE MOUTH RINSE: 15.0000 mL | Freq: Once | OROMUCOSAL | Status: AC

## 2020-02-29 MED ORDER — POVIDONE-IODINE 10 % EX SWAB: 2.0000 "application " | Freq: Once | CUTANEOUS | Status: DC

## 2020-03-01 ENCOUNTER — Other Ambulatory Visit: Payer: Self-pay

## 2020-03-01 ENCOUNTER — Ambulatory Visit: Payer: Medicare Other | Admitting: Urgent Care

## 2020-03-01 ENCOUNTER — Ambulatory Visit
Admission: RE | Admit: 2020-03-01 | Discharge: 2020-03-01 | Disposition: A | Discharge status: Home / Self Care | Payer: Medicare Other | Attending: Obstetrics and Gynecology | Admitting: Obstetrics and Gynecology

## 2020-03-01 ENCOUNTER — Encounter
Admission: RE | Disposition: A | Discharge status: Home / Self Care | Payer: Self-pay | Source: Home / Self Care | Attending: Obstetrics and Gynecology

## 2020-03-01 ENCOUNTER — Encounter: Payer: Self-pay | Admitting: Obstetrics and Gynecology

## 2020-03-01 DIAGNOSIS — Z87891 Personal history of nicotine dependence: Secondary | ICD-10-CM | POA: Diagnosis not present

## 2020-03-01 DIAGNOSIS — N838 Other noninflammatory disorders of ovary, fallopian tube and broad ligament: Secondary | ICD-10-CM | POA: Insufficient documentation

## 2020-03-01 DIAGNOSIS — Z880 Allergy status to penicillin: Secondary | ICD-10-CM | POA: Insufficient documentation

## 2020-03-01 DIAGNOSIS — N8502 Endometrial intraepithelial neoplasia [EIN]: Secondary | ICD-10-CM | POA: Insufficient documentation

## 2020-03-01 HISTORY — PX: ROBOTIC ASSISTED TOTAL HYSTERECTOMY WITH BILATERAL SALPINGO OOPHERECTOMY: SHX6086

## 2020-03-01 SURGERY — HYSTERECTOMY, TOTAL, ROBOT-ASSISTED, LAPAROSCOPIC, WITH BILATERAL SALPINGO-OOPHORECTOMY
Anesthesia: General | Site: Abdomen

## 2020-03-01 MED ORDER — ACETAMINOPHEN 10 MG/ML IV SOLN
INTRAVENOUS | Status: AC
  Filled 2020-03-01: qty 100

## 2020-03-01 MED ORDER — CEFAZOLIN SODIUM-DEXTROSE 2-4 GM/100ML-% IV SOLN
INTRAVENOUS | Status: AC
  Filled 2020-03-01: qty 100

## 2020-03-01 MED ORDER — ROCURONIUM BROMIDE 100 MG/10ML IV SOLN
INTRAVENOUS | Status: DC | PRN
  Administered 2020-03-01: 20 mg via INTRAVENOUS
  Administered 2020-03-01: 50 mg via INTRAVENOUS
  Administered 2020-03-01: 20 mg via INTRAVENOUS

## 2020-03-01 MED ORDER — DEXAMETHASONE SODIUM PHOSPHATE 10 MG/ML IJ SOLN
INTRAMUSCULAR | Status: DC | PRN
  Administered 2020-03-01: 5 mg via INTRAVENOUS

## 2020-03-01 MED ORDER — FENTANYL CITRATE (PF) 100 MCG/2ML IJ SOLN
INTRAMUSCULAR | Status: DC | PRN
  Administered 2020-03-01 (×3): 50 ug via INTRAVENOUS

## 2020-03-01 MED ORDER — LIDOCAINE HCL (CARDIAC) PF 100 MG/5ML IV SOSY
PREFILLED_SYRINGE | INTRAVENOUS | Status: DC | PRN
  Administered 2020-03-01: 80 mg via INTRAVENOUS

## 2020-03-01 MED ORDER — MIDAZOLAM HCL 2 MG/2ML IJ SOLN
INTRAMUSCULAR | Status: AC
  Filled 2020-03-01: qty 2

## 2020-03-01 MED ORDER — ACETAMINOPHEN 500 MG PO TABS: 1000.0000 mg | ORAL_TABLET | Freq: Four times a day (QID) | ORAL | 0 refills | Status: AC

## 2020-03-01 MED ORDER — CHLORHEXIDINE GLUCONATE 0.12 % MT SOLN
OROMUCOSAL | Status: AC
  Administered 2020-03-01: 15 mL via OROMUCOSAL
  Filled 2020-03-01: qty 15

## 2020-03-01 MED ORDER — PHENYLEPHRINE HCL (PRESSORS) 10 MG/ML IV SOLN
INTRAVENOUS | Status: DC | PRN
  Administered 2020-03-01 (×3): 100 ug via INTRAVENOUS

## 2020-03-01 MED ORDER — FENTANYL CITRATE (PF) 100 MCG/2ML IJ SOLN
INTRAMUSCULAR | Status: AC
  Filled 2020-03-01: qty 2

## 2020-03-01 MED ORDER — SODIUM CHLORIDE 0.9 % IV SOLN
INTRAVENOUS | Status: DC | PRN
  Administered 2020-03-01: 50 ug/min via INTRAVENOUS

## 2020-03-01 MED ORDER — PROPOFOL 10 MG/ML IV BOLUS
INTRAVENOUS | Status: AC
  Filled 2020-03-01: qty 40

## 2020-03-01 MED ORDER — PROPOFOL 10 MG/ML IV BOLUS
INTRAVENOUS | Status: DC | PRN
  Administered 2020-03-01: 120 mg via INTRAVENOUS

## 2020-03-01 MED ORDER — FENTANYL CITRATE (PF) 100 MCG/2ML IJ SOLN
25.0000 ug | INTRAMUSCULAR | Status: DC | PRN
  Administered 2020-03-01 (×2): 50 ug via INTRAVENOUS

## 2020-03-01 MED ORDER — LACTATED RINGERS IV SOLN: INTRAVENOUS | Status: DC | PRN

## 2020-03-01 MED ORDER — MEPERIDINE HCL 50 MG/ML IJ SOLN: 6.2500 mg | INTRAMUSCULAR | Status: DC | PRN

## 2020-03-01 MED ORDER — SUGAMMADEX SODIUM 200 MG/2ML IV SOLN
INTRAVENOUS | Status: DC | PRN
  Administered 2020-03-01: 200 mg via INTRAVENOUS

## 2020-03-01 MED ORDER — SODIUM CHLORIDE 0.9 % IR SOLN
Status: DC | PRN
  Administered 2020-03-01: 500 mL

## 2020-03-01 MED ORDER — FAMOTIDINE 20 MG PO TABS
ORAL_TABLET | ORAL | Status: AC
  Administered 2020-03-01: 20 mg via ORAL
  Filled 2020-03-01: qty 1

## 2020-03-01 MED ORDER — SODIUM CHLORIDE 0.9 % IR SOLN
Status: DC | PRN
  Administered 2020-03-01: 1000 mL

## 2020-03-01 MED ORDER — GABAPENTIN 800 MG PO TABS: 800.0000 mg | ORAL_TABLET | Freq: Every day | ORAL | 0 refills | Status: DC

## 2020-03-01 MED ORDER — ONDANSETRON HCL 4 MG/2ML IJ SOLN
INTRAMUSCULAR | Status: DC | PRN
  Administered 2020-03-01: 4 mg via INTRAVENOUS

## 2020-03-01 MED ORDER — OXYCODONE HCL 5 MG PO TABS
5.0000 mg | ORAL_TABLET | ORAL | 0 refills | Status: DC | PRN
Start: 2020-03-01 — End: 2020-04-12

## 2020-03-01 MED ORDER — ACETAMINOPHEN 10 MG/ML IV SOLN
INTRAVENOUS | Status: DC | PRN
  Administered 2020-03-01: 1000 mg via INTRAVENOUS

## 2020-03-01 MED ORDER — BUPIVACAINE HCL (PF) 0.5 % IJ SOLN
INTRAMUSCULAR | Status: AC
  Filled 2020-03-01: qty 30

## 2020-03-01 MED ORDER — PROMETHAZINE HCL 25 MG/ML IJ SOLN: 6.2500 mg | INTRAMUSCULAR | Status: DC | PRN

## 2020-03-01 MED ORDER — FENTANYL CITRATE (PF) 250 MCG/5ML IJ SOLN
INTRAMUSCULAR | Status: AC
  Filled 2020-03-01: qty 5

## 2020-03-01 MED ORDER — BUPIVACAINE HCL (PF) 0.5 % IJ SOLN
INTRAMUSCULAR | Status: DC | PRN
  Administered 2020-03-01: 15 mL

## 2020-03-01 MED ORDER — HEPARIN SODIUM (PORCINE) 5000 UNIT/ML IJ SOLN
INTRAMUSCULAR | Status: AC
  Administered 2020-03-01: 5000 [IU] via SUBCUTANEOUS
  Filled 2020-03-01: qty 1

## 2020-03-01 MED ORDER — OXYCODONE HCL 5 MG/5ML PO SOLN: 5.0000 mg | Freq: Once | ORAL | Status: AC | PRN

## 2020-03-01 MED ORDER — OXYCODONE HCL 5 MG PO TABS
5.0000 mg | ORAL_TABLET | Freq: Once | ORAL | Status: AC | PRN
  Administered 2020-03-01: 5 mg via ORAL

## 2020-03-01 MED ORDER — SENNA 8.6 MG PO TABS: 1.0000 | ORAL_TABLET | Freq: Every day | ORAL | 0 refills | Status: DC | PRN

## 2020-03-01 MED ORDER — IBUPROFEN 800 MG PO TABS: 800.0000 mg | ORAL_TABLET | Freq: Three times a day (TID) | ORAL | 1 refills | Status: AC

## 2020-03-01 MED ORDER — MIDAZOLAM HCL 2 MG/2ML IJ SOLN
INTRAMUSCULAR | Status: DC | PRN
  Administered 2020-03-01: 2 mg via INTRAVENOUS

## 2020-03-01 MED ORDER — OXYCODONE HCL 5 MG PO TABS
ORAL_TABLET | ORAL | Status: AC
  Filled 2020-03-01: qty 1

## 2020-03-01 SURGICAL SUPPLY — 96 items
"PENCIL ELECTRO HAND CTR " (MISCELLANEOUS) ×1 IMPLANT
ADH SKN CLS APL DERMABOND .7 (GAUZE/BANDAGES/DRESSINGS) ×1
ANCHOR TIS RET SYS 235ML (MISCELLANEOUS) IMPLANT
APL PRP STRL LF DISP 70% ISPRP (MISCELLANEOUS) ×1
BAG DRN RND TRDRP ANRFLXCHMBR (UROLOGICAL SUPPLIES) ×1
BAG LAPAROSCOPIC 12 15 PORT 16 (BASKET) IMPLANT
BAG RETRIEVAL 12/15 (BASKET)
BAG RETRIEVAL 12/15MM (BASKET)
BAG TISS RTRVL C235 10X14 (MISCELLANEOUS)
BAG URINE DRAIN 2000ML AR STRL (UROLOGICAL SUPPLIES) ×3 IMPLANT
BLADE SURG SZ11 CARB STEEL (BLADE) ×6 IMPLANT
CANISTER SUCT 1200ML W/VALVE (MISCELLANEOUS) ×1 IMPLANT
CANNULA DILATOR  5MM W/SLV (CANNULA) ×1
CANNULA DILATOR 5 W/SLV (CANNULA) ×1 IMPLANT
CATH FOLEY 2WAY  5CC 16FR (CATHETERS) ×2
CATH FOLEY 2WAY 5CC 16FR (CATHETERS) ×1
CATH URTH 16FR FL 2W BLN LF (CATHETERS) ×1 IMPLANT
CHLORAPREP W/TINT 26 (MISCELLANEOUS) ×3 IMPLANT
CNTNR SPEC 2.5X3XGRAD LEK (MISCELLANEOUS) ×1
CONT SPEC 4OZ STER OR WHT (MISCELLANEOUS) ×2
CONT SPEC 4OZ STRL OR WHT (MISCELLANEOUS) ×1
CONTAINER SPEC 2.5X3XGRAD LEK (MISCELLANEOUS) IMPLANT
COUNTER NEEDLE 20/40 LG (NEEDLE) ×3 IMPLANT
COVER LIGHT HANDLE STERIS (MISCELLANEOUS) ×2 IMPLANT
COVER TIP SHEARS 8 DVNC (MISCELLANEOUS) ×1 IMPLANT
COVER TIP SHEARS 8MM DA VINCI (MISCELLANEOUS) ×2
COVER WAND RF STERILE (DRAPES) ×3 IMPLANT
DEFOGGER SCOPE WARMER CLEARIFY (MISCELLANEOUS) ×3 IMPLANT
DERMABOND ADVANCED (GAUZE/BANDAGES/DRESSINGS) ×2
DERMABOND ADVANCED .7 DNX12 (GAUZE/BANDAGES/DRESSINGS) ×1 IMPLANT
DRAPE 3/4 80X56 (DRAPES) ×9 IMPLANT
DRAPE ARM DVNC X/XI (DISPOSABLE) ×4 IMPLANT
DRAPE COLUMN DVNC XI (DISPOSABLE) ×1 IMPLANT
DRAPE DA VINCI XI ARM (DISPOSABLE) ×8
DRAPE DA VINCI XI COLUMN (DISPOSABLE) ×2
DRAPE LAP W/FLUID (DRAPES) ×3 IMPLANT
DRAPE LEGGINS SURG 28X43 STRL (DRAPES) ×3 IMPLANT
DRAPE UNDER BUTTOCK W/FLU (DRAPES) ×3 IMPLANT
ELECT CAUTERY BLADE 6.4 (BLADE) ×2 IMPLANT
ELECT REM PT RETURN 9FT ADLT (ELECTROSURGICAL) ×3
ELECTRODE REM PT RTRN 9FT ADLT (ELECTROSURGICAL) ×1 IMPLANT
GAUZE 4X4 16PLY RFD (DISPOSABLE) ×3 IMPLANT
GLOVE INDICATOR 7.0 STRL GRN (GLOVE) ×12 IMPLANT
GLOVE SURG SYN 6.5 ES PF (GLOVE) ×18 IMPLANT
GLOVE SURG SYN 6.5 PF PI (GLOVE) ×6 IMPLANT
GOWN STRL REUS W/ TWL LRG LVL3 (GOWN DISPOSABLE) ×8 IMPLANT
GOWN STRL REUS W/TWL LRG LVL3 (GOWN DISPOSABLE) ×21
GRASPER SUT TROCAR 14GX15 (MISCELLANEOUS) ×2 IMPLANT
IRRIGATION STRYKERFLOW (MISCELLANEOUS) IMPLANT
IRRIGATOR STRYKERFLOW (MISCELLANEOUS) ×3
IV NS 1000ML (IV SOLUTION) ×3
IV NS 1000ML BAXH (IV SOLUTION) IMPLANT
KIT IMAGING PINPOINTPAQ (MISCELLANEOUS) IMPLANT
KIT PINK PAD W/HEAD ARE REST (MISCELLANEOUS) ×3
KIT PINK PAD W/HEAD ARM REST (MISCELLANEOUS) ×1 IMPLANT
LABEL OR SOLS (LABEL) ×3 IMPLANT
MANIPULATOR VCARE LG CRV RETR (MISCELLANEOUS) IMPLANT
MANIPULATOR VCARE SML CRV RETR (MISCELLANEOUS) IMPLANT
MANIPULATOR VCARE STD CRV RETR (MISCELLANEOUS) ×2 IMPLANT
MARKER SKIN DUAL TIP RULER LAB (MISCELLANEOUS) ×2 IMPLANT
NDL INSUFF ACCESS 14 VERSASTEP (NEEDLE) ×2 IMPLANT
NDL SPNL 22GX3.5 QUINCKE BK (NEEDLE) ×1 IMPLANT
NEEDLE HYPO 22GX1.5 SAFETY (NEEDLE) ×3 IMPLANT
NEEDLE SPNL 22GX3.5 QUINCKE BK (NEEDLE) ×3 IMPLANT
NEEDLE VERESS 14GA 120MM (NEEDLE) IMPLANT
NS IRRIG 1000ML POUR BTL (IV SOLUTION) ×3 IMPLANT
OBTURATOR OPTICAL STANDARD 8MM (TROCAR) ×2
OBTURATOR OPTICAL STND 8 DVNC (TROCAR) ×1
OBTURATOR OPTICALSTD 8 DVNC (TROCAR) ×1 IMPLANT
OCCLUDER COLPOPNEUMO (BALLOONS) ×3 IMPLANT
PACK LAP CHOLECYSTECTOMY (MISCELLANEOUS) ×3 IMPLANT
PAD OB MATERNITY 4.3X12.25 (PERSONAL CARE ITEMS) ×3 IMPLANT
PAD PREP 24X41 OB/GYN DISP (PERSONAL CARE ITEMS) ×3 IMPLANT
PENCIL ELECTRO HAND CTR (MISCELLANEOUS) ×3 IMPLANT
SEAL CANN UNIV 5-8 DVNC XI (MISCELLANEOUS) ×4 IMPLANT
SEAL XI 5MM-8MM UNIVERSAL (MISCELLANEOUS) ×8
SEALER VESSEL DA VINCI XI (MISCELLANEOUS)
SEALER VESSEL EXT DVNC XI (MISCELLANEOUS) IMPLANT
SET CYSTO W/LG BORE CLAMP LF (SET/KITS/TRAYS/PACK) IMPLANT
SET TUBE SMOKE EVAC HIGH FLOW (TUBING) ×3 IMPLANT
SLEEVE ENDOPATH XCEL 5M (ENDOMECHANICALS) ×2 IMPLANT
SLEEVE VERSASTEP EXPAND ONEST (MISCELLANEOUS) ×6 IMPLANT
SOLUTION ELECTROLUBE (MISCELLANEOUS) ×3 IMPLANT
SUT DVC VLOC 180 0 12IN GS21 (SUTURE) ×3
SUT MNCRL 4-0 (SUTURE) ×6
SUT MNCRL 4-0 27XMFL (SUTURE) ×2
SUT VIC AB 0 CT1 36 (SUTURE) ×3 IMPLANT
SUT VICRYL 0 UR6 27IN ABS (SUTURE) ×3 IMPLANT
SUTURE DVC VLC 180 0 12IN GS21 (SUTURE) IMPLANT
SUTURE MNCRL 4-0 27XMF (SUTURE) ×1 IMPLANT
SYR 10ML LL (SYRINGE) ×3 IMPLANT
SYR 3ML LL SCALE MARK (SYRINGE) ×2 IMPLANT
SYR 50ML LL SCALE MARK (SYRINGE) ×5 IMPLANT
SYR TOOMEY 50ML (SYRINGE) IMPLANT
TROCAR XCEL NON-BLD 11X100MML (ENDOMECHANICALS) ×3 IMPLANT
TROCAR XCEL NON-BLD 5MMX100MML (ENDOMECHANICALS) ×3 IMPLANT

## 2020-03-01 NOTE — Transfer of Care (Signed)
Immediate Anesthesia Transfer of Care Note  Patient: Hannah Burke  Procedure(s) Performed: XI ROBOTIC ASSISTED TOTAL HYSTERECTOMY WITH BILATERAL SALPINGO OOPHORECTOMY POSSIBLE NODE DISSECTION, POSSIBLE LAPAROTOMY (N/A Abdomen)  Patient Location: PACU  Anesthesia Type:General  Level of Consciousness: awake, alert  and oriented  Airway & Oxygen Therapy: Patient Spontanous Breathing  Post-op Assessment: Report given to RN and Post -op Vital signs reviewed and stable  Post vital signs: Reviewed and stable  Last Vitals:  Vitals Value Taken Time  BP 138/80 03/01/20 1035  Temp 36.2 C 03/01/20 1034  Pulse 72 03/01/20 1040  Resp 20 03/01/20 1040  SpO2 97 % 03/01/20 1040  Vitals shown include unvalidated device data.  Last Pain:  Vitals:   03/01/20 1034  TempSrc:   PainSc: Asleep         Complications: No complications documented.

## 2020-03-01 NOTE — Anesthesia Postprocedure Evaluation (Signed)
Anesthesia Post Note  Patient: JANICA ELDRED  Procedure(s) Performed: XI ROBOTIC ASSISTED TOTAL HYSTERECTOMY WITH BILATERAL SALPINGO OOPHORECTOMY POSSIBLE NODE DISSECTION, POSSIBLE LAPAROTOMY (N/A Abdomen)  Patient location during evaluation: PACU Anesthesia Type: General Level of consciousness: awake and alert and oriented Pain management: pain level controlled Vital Signs Assessment: post-procedure vital signs reviewed and stable Respiratory status: spontaneous breathing, nonlabored ventilation and respiratory function stable Cardiovascular status: blood pressure returned to baseline and stable Postop Assessment: no signs of nausea or vomiting Anesthetic complications: no   No complications documented.   Last Vitals:  Vitals:   03/01/20 1138 03/01/20 1214  BP: 122/71 120/67  Pulse: 76 72  Resp: 16 18  Temp: 36.8 C 36.6 C  SpO2: 99% 99%    Last Pain:  Vitals:   03/01/20 1214  TempSrc: Temporal  PainSc: 3                  Torsten Weniger

## 2020-03-01 NOTE — Interval H&P Note (Signed)
History and Physical Interval Note:  03/01/2020 8:22 AM  Hannah Burke  has presented today for surgery, with the diagnosis of Endometrial intraepithelial neoplasia.  The various methods of treatment have been discussed with the patient and family. After consideration of risks, benefits and other options for treatment, the patient has consented to  Procedure(s): XI ROBOTIC ASSISTED TOTAL HYSTERECTOMY WITH BILATERAL SALPINGO OOPHORECTOMY POSSIBLE NODE DISSECTION, POSSIBLE LAPAROTOMY (N/A) as a surgical intervention.  The patient's history has been reviewed, patient examined, no change in status, stable for surgery.  I have reviewed the patient's chart and labs.  Questions were answered to the patient's satisfaction.     Benjaman Kindler

## 2020-03-01 NOTE — Anesthesia Preprocedure Evaluation (Signed)
Anesthesia Evaluation  Patient identified by MRN, date of birth, ID band Patient awake    Reviewed: Allergy & Precautions, NPO status , Patient's Chart, lab work & pertinent test results  History of Anesthesia Complications Negative for: history of anesthetic complications  Airway Mallampati: III  TM Distance: <3 FB Neck ROM: Full    Dental no notable dental hx.    Pulmonary neg sleep apnea, neg COPD, former smoker,    breath sounds clear to auscultation- rhonchi (-) wheezing      Cardiovascular Exercise Tolerance: Good (-) hypertension(-) CAD, (-) Past MI, (-) Cardiac Stents and (-) CABG  Rhythm:Regular Rate:Normal - Systolic murmurs and - Diastolic murmurs    Neuro/Psych neg Seizures negative neurological ROS  negative psych ROS   GI/Hepatic negative GI ROS, Neg liver ROS,   Endo/Other  negative endocrine ROSneg diabetes  Renal/GU negative Renal ROS     Musculoskeletal negative musculoskeletal ROS (+)   Abdominal (+) - obese,   Peds  Hematology negative hematology ROS (+)   Anesthesia Other Findings Past Medical History: No date: Allergy No date: Cancer [facility])     Comment:  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, arms, legs and back 01/2020: Cancer ([facility])     Comment:  EIN (endometrial intraepithelial neoplasia) No date: PMB (postmenopausal bleeding)   Reproductive/Obstetrics                            Anesthesia Physical Anesthesia Plan  ASA: II  Anesthesia Plan: General   Post-op Pain Management:    Induction: Intravenous  PONV Risk Score and Plan: 2  Airway Management Planned: Oral ETT and Video Laryngoscope Planned  Additional Equipment:   Intra-op Plan:   Post-operative Plan: Extubation in OR  Informed Consent: I have reviewed the patients History and Physical, chart, labs and discussed the procedure including the risks, benefits and alternatives for the proposed anesthesia with the  patient or authorized representative who has indicated his/her understanding and acceptance.     Dental advisory given  Plan Discussed with: CRNA and Anesthesiologist  Anesthesia Plan Comments:         Anesthesia Quick Evaluation

## 2020-03-01 NOTE — Anesthesia Procedure Notes (Signed)
Procedure Name: Intubation Date/Time: 03/01/2020 8:39 AM Performed by: Louann Sjogren, CRNA Pre-anesthesia Checklist: Patient identified, Patient being monitored, Timeout performed, Emergency Drugs available and Suction available Patient Re-evaluated:Patient Re-evaluated prior to induction Oxygen Delivery Method: Circle system utilized Preoxygenation: Pre-oxygenation with 100% oxygen Induction Type: IV induction Ventilation: Mask ventilation without difficulty Laryngoscope Size: 3 and McGraph Grade View: Grade III Tube type: Oral Tube size: 7.0 mm Number of attempts: 1 Airway Equipment and Method: Stylet Placement Confirmation: ETT inserted through vocal cords under direct vision,  positive ETCO2 and breath sounds checked- equal and bilateral Secured at: 21 cm Tube secured with: Tape Dental Injury: Teeth and Oropharynx as per pre-operative assessment  Comments: Recommend  Useing video blade and having a bougie readily available in the future.

## 2020-03-01 NOTE — Discharge Instructions (Signed)
Discharge instructions after  robotically-assisted total laparoscopic hysterectomy   For the next three days, take ibuprofen and acetaminophen on a schedule, every 8 hours. You can take them together or you can intersperse them, and take one every four hours. I also gave you gabapentin for nighttime, to help you sleep and also to control pain. Take gabapentin medicines at night for at least the next 3 nights. You also have a narcotic, oxycodone, to take as needed if the above medicines don't help.  Postop constipation is a major cause of pain. Stay well hydrated, walk as you tolerate, and take over the counter senna as well as stool softeners if you need them.   Signs and Symptoms to Report Call our office at [phone] if you have any of the following.  . Fever over 100.4 degrees or higher . Severe stomach pain not relieved with pain medications . Bright red bleeding that's heavier than a period that does not slow with rest . To go the bathroom a lot (frequency), you can't hold your urine (urgency), or it hurts when you empty your bladder (urinate) . Chest pain . Shortness of breath . Pain in the calves of your legs . Severe nausea and vomiting not relieved with anti-nausea medications . Signs of infection around your wounds, such as redness, hot to touch, swelling, green/yellow drainage (like pus), bad smelling discharge . Any concerns  What You Can Expect after Surgery . You may see some pink tinged, bloody fluid and bruising around the wound. This is normal. . You may notice shoulder and neck pain. This is caused by the gas used during surgery to expand your abdomen so your surgeon could get to the uterus easier. . You may have a sore throat because of the tube in your mouth during general anesthesia. This will go away in 2 to 3 days. . You may have some stomach cramps. . You may notice spotting on your panties. . You may have pain around the incision sites.   Activities after  Your Discharge Follow these guidelines to help speed your recovery at home: . Do the coughing and deep breathing as you did in the hospital for 2 weeks. Use the small blue breathing device, called the incentive spirometer for 2 weeks. . Don't drive if you are in pain or taking narcotic pain medicine. You may drive when you can safely slam on the brakes, turn the wheel forcefully, and rotate your torso comfortably. This is typically 1-2 weeks. Practice in a parking lot or side street prior to attempting to drive regularly.  . Ask others to help with household chores for 4 weeks. . Do not lift anything heavier that 10 pounds for 4-6 weeks. This includes pets, children, and groceries. . Don't do strenuous activities, exercises, or sports like vacuuming, tennis, squash, etc. until your doctor says it is safe to do so. ---Maintain pelvic rest for 12 weeks. This means nothing in the vagina or rectum at all (no douching, tampons, intercourse) for 12 weeks.  . Walk as you feel able. Rest often since it may take two or three weeks for your energy level to return to normal.  . You may climb stairs . Avoid constipation:   -Eat fruits, vegetables, and whole grains. Eat small meals as your appetite will take time to return to normal.   -Drink 6 to 8 glasses of water each day unless your doctor has told you to limit your fluids.   -Use a laxative or   stool softener as needed if constipation becomes a problem. You may take Miralax, metamucil, Citrucil, Colace, Senekot, FiberCon, etc. If this does not relieve the constipation, try two tablespoons of Milk Of Magnesia every 8 hours until your bowels move.  . You may shower. Gently wash the wounds with a mild soap and water. Pat dry. . Do not get in a hot tub, swimming pool, etc. for 6 weeks. . Do not use lotions, oils, powders on the wounds. . Do not douche, use tampons, or have sex until your doctor says it is okay. . Take your pain medicine when you need it. The  medicine may not work as well if the pain is bad.  Take the medicines you were taking before surgery. Other medications you will need are pain medications (Norco or Percocet) and nausea medications (Zofran).    Here is a helpful article from the website [employer], regarding constipation  Here are reasons why constipation occurs after surgery: 1) During the operation and in the recovery room, most people are given opioid pain medication, primarily through an IV, to treat moderate or severe pain. Intravenous opioids include morphine, Dilaudid and fentanyl. After surgery, patients are often prescribed opioid pain medication to take by mouth at home, including codeine, Vicodin, Norco, and Percocet. All of these medications cause constipation by slowing down the movement of your intestine. 2) Changes in your diet before surgery can be another culprit. It is common to get specific instructions to change how you normally eat or drink before your surgery, like only having liquids the day before or not having anything to eat or drink after midnight the night before surgery. For this reason, temporary dehydration may occur. This, along with not eating or only having liquids, means that you are getting less fiber than usual. Both these factors contribute to constipation. 3) Changes in your diet after surgery can also contribute to the problem. Although many people don't have dietary restrictions after operations, being under anesthesia can make you lose your appetite for several hours and maybe even days. Some people can even have nausea or vomiting. Not eating or drinking normally means that you are not getting enough fiber and you can get dehydrated, both leading to constipation. 4) Lying in a bed more than usual--which happens before, during and after surgery--combined with the medications and diet changes, all work together to slow down your colon and make your poop turn to rock.  No one likes to be  constipated.  Let's face it, it's not a pleasant feeling when you don't poop for days, then strain on the toilet to finally pass something large enough to cause damage. An ounce of prevention is worth a pound of cure, so: 1. Assume you will be constipated. 2. Plan and prepare accordingly. Post-surgery is one of those unique situations where the temporary use of laxatives can make a world of difference. Always consult with your doctor, and recognize that if you wait several days after surgery to take a laxative, the constipation might be too severe for these over-the-counter options. It is always important to discuss all medications you plan on taking with your doctor. Ask your doctor if you can start the laxative immediately after surgery. *  Here are go-to post-surgery laxatives: Senna: Senna is an herb that acts as a "stimulant laxative," meaning it increases the activity of the intestine to cause you to have a bowel movement. It comes in many forms, but senna pills are easy to take and are  sold over the counter at almost all pharmacies. Since opioid pain medications slow down the activity of the intestine, it makes sense to take a medication to help reverse that side effect. Long-term use of a stimulant laxative is not a good idea since it can make your colon "lazy" and not function properly; however, temporary use immediately after surgery is acceptable. In general, if you are able to eat a normal diet, taking senna soon after surgery works the best. Senna usually works within hours to produce a bowel movement, but this is less predictable when you are taking different medications after surgery. Try not to wait several days to start taking senna, as often it is too late by then. Just like with all medications or supplements, check with your doctor before starting new treatment.   Magnesium: Magnesium is an important mineral that our body needs. We get magnesium from some foods that we eat, especially  foods that are high in fiber such as broccoli, almonds and whole grains. There are also magnesium-based medications used to treat constipation including milk of magnesia (magnesium hydroxide), magnesium citrate and magnesium oxide. They work by drawing water into the intestine, putting it into the class of "osmotic" laxatives. Magnesium products in low doses appear to be safe, but if taken in very large doses, can lead to problems such as irregular heartbeat, low blood pressure and even death. It can also affect other medications you might be taking, therefore it is important to discuss using magnesium with your physician and pharmacist before initiating therapy. Most over-the-counter magnesium laxatives work very well to help with the constipation related to surgery, but sometimes they work too well and lead to diarrhea. Make sure you are somewhere with easy access to a bathroom, just in case.   Bisacodyl: Bisacodyl (generic name) is sold under brand names such as Dulcolax. Much like senna, it is a "stimulant laxative," meaning it makes your intestines move more quickly to push out the stool. This is another good choice to start taking as soon as your doctor says you can take a laxative after surgery. It comes in pill form and as a suppository, which is a good choice for people who cannot or are not allowed to swallow pills. Studies have shown that it works as a laxative, but like most of these medications, you should use this on a short-term basis only.   Enema: Enemas strike fear in many people, but FEAR NOT! It's nowhere near as big a deal as you may think. An enema is just a way to get some liquid into your rectum by placing a specially designed device through your anus. If you have never done one, it might seem like a painful, unpleasant, uncomfortable, complicated and lengthy procedure. But in reality, it's simple, takes just a few seconds and is highly effective. The small ready-made bottles you buy at  the pharmacy are much easier than the hose/large rubber container type. Those recommended positions illustrated in some instructions are generally not necessary to place the enema. It's very similar to the insertion of a tampon, requiring a slight squat. Some extra lubrication on the enema's tip (or on your anus) will make it a breeze. In certain cases, there is no substitute for a good enema. For example, if someone has not pooped for a few days, the beginning of the poop waiting to come out can become rock hard. Passing that hard stool can lead to much pain and problems like anal fissures. Inserting a  little liquid to break up the rock-hard stool will help make its passage much easier. Enemas come with different liquids. Most come with saline, but there are also mineral oil options. You can also use warm water in the reusable enema containers. They all work. But since saline can sometimes be irritating, so try a mineral oil or water enema instead.  Here are commonly recommended constipation medications that do not work well for post-surgery constipation: Docusate: Docusate (generic name) most commonly referred to as Colace (brand name) is not really a laxative, but is classified as a stool softener. Although this medication is commonly prescribed, it is not recommended for several reasons: 1) there is no good medical evidence that it works 2) even if it has an effect, which is very questionable, it is minimal and cannot combat the intestinal slowing caused by the opioid medications. Skip docusate to save money and space in your pillbox for something more effective.  PEG: Miralax (brand name) is basically a chemical called polyethylene glycol (PEG) and it has gained tremendous popularity as a laxative. This product is an "osmotic laxative" meaning it works by pulling water into the stool, making it softer. This is very similar to the action of natural fiber in foods and supplements. Therefore, the effect seen  by this medication is not immediate, causing a bowel movement in a day or more. Is this medication strong enough to battle the constipation related to having an operation? Maybe for some people not prone to constipation. But for most people, other laxatives are better to prevent constipation after surgery.   AMBULATORY SURGERY  DISCHARGE INSTRUCTIONS   1) The drugs that you were given will stay in your system until tomorrow so for the next 24 hours you should not:  A) Drive an automobile B) Make any legal decisions C) Drink any alcoholic beverage   2) You may resume regular meals tomorrow.  Today it is better to start with liquids and gradually work up to solid foods.  You may eat anything you prefer, but it is better to start with liquids, then soup and crackers, and gradually work up to solid foods.   3) Please notify your doctor immediately if you have any unusual bleeding, trouble breathing, redness and pain at the surgery site, drainage, fever, or pain not relieved by medication.    4) Additional Instructions:        Please contact your physician with any problems or Same Day Surgery at [phone], Monday through Friday 6 am to 4 pm, or [place] at [facility] number at [phone].

## 2020-03-01 NOTE — Op Note (Signed)
Operative Note   Date 03/01/2020 TIME 11:13 AM  PRE-OP DIAGNOSIS: EIN  POST-OP DIAGNOSIS: EIN  SURGEON: Surgeon(s) and Role: Panel 1:  Burnis Halling Gaetana Michaelis, MD  ASSISTANT:  Benjaman Kindler, MD  ANESTHESIA: General  PROCEDURE: Procedure(s): Exam under anesthesia, Robotic assisted total hysterectomy and bilateral salpingo-oophorectomy with washings  ESTIMATED BLOOD LOSS: 25 cc  DRAINS: None  TOTAL IV FLUIDS: per anesthesia note  UOP: approximately 400 cc  SPECIMENS:  Uterus, cervix, and bilateral fallopian tubes and ovaries, and washings.   COMPLICATIONS: None  DISPOSITION: PACU  CONDITION: Stable  INDICATIONS: EIN  FINDINGS: Exam under anesthesia revealed an normal 8 week mobile anteverted uterus. There were no adnexal masses or nodularity. The parametria was smooth. The cervix was negative for gross lesions. Uterus sounded to 8.5 cm. Intraoperative findings included: The uterus was normal size and shape. The adnexa were normal bilaterally. The abdomen was normal including omentum, bowel, appendix, liver, stomach, and diaphragmatic surfaces. There was no evidence of grossly enlarged pelvic or right para-aortic lymph nodes. The uterus was opened at the bed side and there was not an enlarged/abnormal intrauterine mass.   PROCEDURE IN DETAIL: After informed consent was obtained, the patient was taken to the operating room where anesthesia was obtained without difficulty. The patient was positioned in the dorsal lithotomy position in [facility] [place] and her arms were carefully tucked at her sides and the usual precautions were taken.  She was prepped and draped in normal sterile fashion.  Time-out was performed.  A speculum was placed in the vagina and the cervical os was dilator. The uterus sounded to 8.5 cm.  A medium V-Care uterine manipulator was then placed in the uterus without incident.    Operative entry was obtained via a supraumbilical incision and direct entry  using the OptiView. The abdomen insuffulated, and pelvis visualized with noted findings above. The robotic ports and LUQ port placement, and the patient was placed in Trendelenburg. The bowel was displaced up into the upper abdomen.  The  robotic docking was performed. Round ligaments were divided on each side and the retroperitoneal space was opened bilaterally. The infundibulopelvic ligaments were skeletonized, and the ureters were identified and preserved.    The bilateral infundibular ligaments were sealed and divided.  A bladder flap was created and the bladder was dissected down off the lower uterine segment and cervix. The uterus was manipulated to allow exposure of the uterine arteries. The uterine arteries were skeletonized bilaterally, sealed and divided with cautery and transected.  A colpotomy was performed circumferentially along the V-Care ring with electrocautery and the cervix was incised from the vagina. The V-care and specimen was removed.  A pneumo balloon was placed in the vagina and ring forceps used to removed both sentinel lymph nodes. The vaginal cuff was then closed in a running continuous fashion using 0 V-Lock suture with careful attention to include the vaginal cuff angles and the vaginal mucosa within the closure.    Hemostasis was observed. The intraperitoneal pressure was dropped, and all planes of dissection, vascular pedicles and the vaginal cuff were found to be hemostatic.  The uterine specimen was opened and there was no indication to proceed with intraoperative frozen section. The trocars were removed and the fascia of the umbilical port was closed with 0 Vicryl suture. The skin incision at the umbilicus was closed with subcuticular stitch.  The remaining skin incisions were closed with subcuticular stitch and Indermil glue.  The patient tolerated the procedure well.  Sponge, lap and needle counts were correct x2.  The patient was taken to recovery room in excellent  condition.  Antibiotics: Given 1st or 2nd generation cephalosporin, Antibiotics given within 1 hour of the start of the procedure, Antibiotics ordered to be discontinued within 24 hours post procedure   VTE prophylaxis: was ordered perioperatively.   Surgery required a high-level surgical assistant with none other readily available. Dr. Benjaman Kindler assisted me with the entire procedure.    Gillis Ends, MD

## 2020-03-02 ENCOUNTER — Encounter: Payer: Self-pay | Admitting: Obstetrics and Gynecology

## 2020-03-02 LAB — CYTOLOGY - NON PAP

## 2020-03-06 LAB — SURGICAL PATHOLOGY

## 2020-03-15 ENCOUNTER — Telehealth: Payer: Self-pay | Admitting: Obstetrics and Gynecology

## 2020-03-15 NOTE — Telephone Encounter (Signed)
I called Hannah Burke to reviewe final pathology results. There was no answer and I left a voicemail for her  She has a great report and no further treatment is required. She will return to clinic as scheduled unless she has an postop follow up scheduled with Dr. Leafy Ro.  Cassundra Mckeever Gaetana Michaelis, MD    Specimen Submitted:  A. Uterus, cervix, bilat fallopian tubes ovaries   Clinical History: Endometrial intraepithelial neoplasia.     DIAGNOSIS:  A. UTERUS WITH CERVIX, BILATERAL FALLOPIAN TUBES AND OVARIES; TOTAL  HYSTERECTOMY WITH BILATERAL SALPINGO-OOPHORECTOMY:  - UTERINE CERVIX:    - BENIGN TRANSFORMATION ZONE.    - NEGATIVE FOR SQUAMOUS INTRAEPITHELIAL LESION AND MALIGNANCY.  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    - NEGATIVE FOR MALIGNANCY.  - MYOMETRIUM:    - NO SIGNIFICANT HISTOPATHOLOGIC CHANGE.  - FALLOPIAN TUBES:    - BENIGN PARATUBAL CYSTS.    - OTHERWISE NO SIGNIFICANT HISTOPATHOLOGIC CHANGE.  - OVARIES:    - BENIGN PHYSIOLOGIC CHANGES.   DIAGNOSIS:  A. PELVIC WASHINGS; LIQUID BASE PREPARATION:  - NO MALIGNANCY IDENTIFIED.  - REACTIVE MESOTHELIAL CELLS AND MIXED INFLAMMATORY CELLS.

## 2020-04-12 ENCOUNTER — Inpatient Hospital Stay: Payer: Medicare Other | Attending: Obstetrics and Gynecology | Admitting: Obstetrics and Gynecology

## 2020-04-12 VITALS — BP 138/95 | HR 84 | Temp 98.7°F | Resp 20 | Wt 153.9 lb

## 2020-04-12 DIAGNOSIS — Z90722 Acquired absence of ovaries, bilateral: Secondary | ICD-10-CM

## 2020-04-12 DIAGNOSIS — N8502 Endometrial intraepithelial neoplasia [EIN]: Secondary | ICD-10-CM

## 2020-04-12 DIAGNOSIS — Z90710 Acquired absence of both cervix and uterus: Secondary | ICD-10-CM

## 2020-04-12 NOTE — Progress Notes (Signed)
Gynecologic Oncology Interval Visit   Referring Provider: Benjaman Kindler, MD  Chief Concern: EIN postoperative visit  Subjective:  Hannah Burke is a 64 y.o. G2P2 female who is seen in consultation from Dr. Leafy Ro for EIN.  She presents today for her postoperative visit. She underwent Exam under anesthesia, Robotic assisted total hysterectomy and bilateral salpingo-oophorectomy with washings on 03/01/2020. She is doing well and has no complaints.   Path DIAGNOSIS:  A. UTERUS WITH CERVIX, BILATERAL FALLOPIAN TUBES AND OVARIES; TOTAL  HYSTERECTOMY WITH BILATERAL SALPINGO-OOPHORECTOMY:  - UTERINE CERVIX:    - BENIGN TRANSFORMATION ZONE.    - NEGATIVE FOR SQUAMOUS INTRAEPITHELIAL LESION AND MALIGNANCY.  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    - NEGATIVE FOR MALIGNANCY.  - MYOMETRIUM:    - NO SIGNIFICANT HISTOPATHOLOGIC CHANGE.  - FALLOPIAN TUBES:    - BENIGN PARATUBAL CYSTS.    - OTHERWISE NO SIGNIFICANT HISTOPATHOLOGIC CHANGE.  - OVARIES:    - BENIGN PHYSIOLOGIC CHANGES.   Comment:  This case is reviewed in conjunction with the prior endometrial biopsy  ([id number]). EIN present in the current specimen appears  morphologically similar to the previous biopsy. There is no evidence of  invasive endometrioid adenocarcinoma.   Washings:  DIAGNOSIS:  A. PELVIC WASHINGS; LIQUID BASE PREPARATION:  - NO MALIGNANCY IDENTIFIED.  - REACTIVE MESOTHELIAL CELLS AND MIXED INFLAMMATORY CELLS.   Gynecologic Oncology History  She presented initially with postmenopausal bleeding. Work up included the following: Last pap smear: 10/2019 neg/neg EMBx1/2021: Inactive, benign  SIS 05/2019: polyps and submucosal fibroids  TVUS 05/2019:  Retroverted Ut=5.24 x 3.53 x 4.45 cm Endometrium=14.34 mm Polyps seen,1=0.62 x 0.43 x 0.76 cm, 2=0.76 x 0.85 x 1.06 cm Neither ov  seen,NoAdnexal masses seen  On 01/21/2020 she underwent exam under anesthesia, fractional D&C, hysteroscopy, and Myosure polypectomy/myomectomy. Uterus sounded to 7.5 cm and findings demonstrated two fundal polyps, two possible fibroid vs polyp in the posterior lower uterine segment, normal tubal ostia.  Her pathology is noted below.    DIAGNOSIS:  A. ENDOCERVIX, CURETTAGE:  - BENIGN ENDOCERVICAL AND SQUAMOUS MUCOSA.  - NEGATIVE FOR DYSPLASIA AND MALIGNANCY.   B. ENDOMETRIUM, CURETTAGE:  - SCANT, SUPERFICIAL FRAGMENTS OF EIN / ATYPICAL HYPERPLASIA, SIMILAR TO  THAT SEEN IN PART B.   C. 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   Problem List: Patient Active Problem List   Diagnosis Date Noted  . 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 Allergy   . Cancer (HCC)    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, arms, legs and back  . Cancer ([facility]) 01/2020   EIN (endometrial intraepithelial neoplasia)  . PMB (postmenopausal bleeding)     Past Surgical History: Past Surgical History:  Procedure Laterality Date  . COLONOSCOPY    . DILATION AND CURETTAGE OF UTERUS    . HYSTEROSCOPY WITH D & C N/A 01/21/2020   Procedure: DILATATION AND CURETTAGE /HYSTEROSCOPY, POLYPECTOMY;  Surgeon: Benjaman Kindler, MD;  Location: [facility] ORS;  Service: Gynecology;  Laterality: N/A;  Total Deficit 280 Total Fluid 1138 Cutting time 01:11  . ROBOTIC ASSISTED TOTAL HYSTERECTOMY WITH BILATERAL SALPINGO OOPHERECTOMY N/A 03/01/2020   Procedure: XI ROBOTIC ASSISTED TOTAL HYSTERECTOMY WITH BILATERAL SALPINGO OOPHORECTOMY POSSIBLE NODE DISSECTION, POSSIBLE LAPAROTOMY;  Surgeon: Gillis Ends, MD;  Location: [facility] ORS;  Service: Gynecology;  Laterality: N/A;    Past Gynecologic History:  Menarche: age 76 Post  menopausal  History of Abnormal pap:  Last pap: 10/2019 neg/neg   OB History:  G2P2 Vaginal deliveries OB History  Gravida Para Term Preterm AB Living  2 2          SAB IAB Ectopic Multiple Live Births               # Outcome Date GA Lbr Len/2nd Weight Sex Delivery Anes PTL Lv  2 Para           1 Para             Obstetric Comments  NSVD x 2    Family History: Family History  Problem Relation Age of Onset  . Hypertension Mother   . Diabetes Father   . Breast cancer Neg Hx     Social History: Social History   Socioeconomic History  . Marital status: Married    Spouse name: dwayne  . Number of children: Not on file  . Years of education: Not on file  . Highest education level: Not on file  Occupational History  . 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 Smoking status: Former Smoker    Types: Cigarettes    Quit date: 01/28/2019    Years since quitting: 1.2  . Smokeless tobacco: Never Used  Vaping Use  . Vaping Use: Never used  Substance and Sexual Activity  . Alcohol use: Yes    Alcohol/week: 7.0 standard drinks    Types: 7 Glasses of wine per week    Comment: patient states occasional wine  . Drug use: Never  . Sexual activity: Yes  Other Topics Concern  . Not on file  Social History Narrative   Lives with husband and son.   Feels safe in her home.   Social Determinants of Health   Financial Resource Strain: Not on file  Food Insecurity: Not on file  Transportation Needs: Not on file  Physical Activity: Not on file  Stress: Not on file  Social Connections: Not on file  Intimate Partner Violence: Not on file    Allergies: Allergies  Allergen Reactions  . Amoxil [Amoxicillin] Other (See Comments)    Unknown/ Childhood (mother does not even remember what reaction was)  . Penicillins Other (See Comments)    Unknown / Childhood (mother does not even know reaction)    Current  Medications: Current Outpatient Medications  Medication Sig Dispense Refill  . aspirin EC 81 MG tablet Take 81 mg by mouth daily. Swallow whole.    Marland Kitchen CALCIUM CITRATE-VITAMIN D3 PO Take 1 capsule by mouth daily. 630 mg /12.5 mcg    . cholecalciferol (VITAMIN D) 25 MCG (1000 UNIT) tablet Take 1,000 Units by mouth daily.    . Glycerin-Hypromellose-PEG 400 (DRY EYE RELIEF DROPS) 0.2-0.2-1 % SOLN Place 1 drop into both eyes daily as needed (moisturizing eye drops).     . loratadine (CLARITIN) 10 MG tablet Take 10 mg by mouth daily.    . Omega 3 1200 MG CAPS Take 1,200 mg by mouth 2 (two) times daily.    . Wheat Dextrin (BENEFIBER PO) Take 1 Dose by mouth daily.    Marland Kitchen gabapentin (NEURONTIN) 800 MG tablet Take 1 tablet (800 mg total) by mouth at bedtime for 14 days. Take nightly for 3 days, then up to 14 days as needed 14 tablet 0  .  oxyCODONE (OXY IR/ROXICODONE) 5 MG immediate release tablet Take 1 tablet (5 mg total) by mouth every 4 (four) hours as needed for severe pain. 20 tablet 0  . senna (SENOKOT) 8.6 MG TABS tablet Take 1 tablet (8.6 mg total) by mouth daily as needed for mild constipation or moderate constipation. 120 tablet 0   No current facility-administered medications for this visit.    Review of Systems General:  no complaints Skin: no complaints Eyes: no complaints HEENT: no complaints Breasts: no complaints Pulmonary: no complaints Cardiac: no complaints Gastrointestinal: no complaints Genitourinary/Sexual: no complaints Ob/Gyn: no complaints Musculoskeletal: no complaints Hematology: no complaints Neurologic/Psych: no complaints     Objective:  Physical Examination:  BP (!) 138/95   Pulse 84   Temp 98.7 F (37.1 C)   Resp 20   Wt 153 lb 14.4 oz (69.8 kg)   SpO2 98%   BMI 26.83 kg/m    GENERAL: Patient is a well appearing female in no acute distress HEENT:  PERRL, neck supple with midline trachea. Thyroid without masses.  ABDOMEN:  Soft, nontender, and  nondistended. No masses or hernias INCISION: closed, dry and intact.  EXTREMITIES:  No peripheral edema.   NEURO:  Nonfocal. Well oriented.  Appropriate affect.  Pelvic: Chaperoned by CMA EGBUS: no lesions, Vagina: no lesions, discharge or bleeding. Vaginal cuff healing.  Cervix, Uterus, Adnexa: surgically absent. BME: no masses, cuff intact, and nontender.  Rectovaginal: deferred.   Lab Review n/a  Radiologic Imaging: As noted per history    Assessment:  Hannah Burke is a 64 y.o. female diagnosed with EIN s/p RA TLH BSO pathology negative for malignancy. Excellent postoperative recovery.   Medical co-morbidities complicating care: n/a Plan:   Problem List Items Addressed This Visit      Genitourinary   EIN (endometrial intraepithelial neoplasia) - Primary     We discussed pathology findings and recommended continued follow up with Dr. Leafy Ro for well woman care.    Verlon Au, NP  I also discussed sexual intercourse. To wait until 3 months postoperatively; positioning and use of lubrication. Reviewed risk of vaginal cuff dehiscence; precautions and symptoms. We also reviewed no heavy lifting for 3 months and risk of hernia. She will contact us if she has any additional concerns.   I personally had a face to face interaction and evaluated the patient jointly with the NP, Ms. Beckey Rutter.  I have reviewed her history and available records and have performed the key portions of the physical exam including HEENT, general, abdominal exam, pelvic exam with my findings confirming those documented above by the APP.  I have discussed the case with the APP and the patient.  I agree with the above documentation, assessment and plan which was fully formulated by me.  Counseling was completed by me.   I personally saw the patient and performed a substantive portion of this encounter in conjunction with the listed APP as documented above.  Ilay Capshaw Gaetana Michaelis, MD  CC:  Benjaman Kindler, [place],  [place] [address] [phone]

## 2020-09-04 ENCOUNTER — Other Ambulatory Visit: Payer: Self-pay

## 2020-09-04 ENCOUNTER — Ambulatory Visit (INDEPENDENT_AMBULATORY_CARE_PROVIDER_SITE_OTHER): Payer: Medicare Other | Admitting: Dermatology

## 2020-09-04 ENCOUNTER — Encounter: Payer: Self-pay | Admitting: Dermatology

## 2020-09-04 DIAGNOSIS — L905 Scar conditions and fibrosis of skin: Secondary | ICD-10-CM

## 2020-09-04 DIAGNOSIS — L814 Other melanin hyperpigmentation: Secondary | ICD-10-CM

## 2020-09-04 DIAGNOSIS — D229 Melanocytic nevi, unspecified: Secondary | ICD-10-CM

## 2020-09-04 DIAGNOSIS — Z85828 Personal history of other malignant neoplasm of skin: Secondary | ICD-10-CM | POA: Diagnosis not present

## 2020-09-04 DIAGNOSIS — Z872 Personal history of diseases of the skin and subcutaneous tissue: Secondary | ICD-10-CM | POA: Diagnosis not present

## 2020-09-04 DIAGNOSIS — Z1283 Encounter for screening for malignant neoplasm of skin: Secondary | ICD-10-CM

## 2020-09-04 DIAGNOSIS — D2272 Melanocytic nevi of left lower limb, including hip: Secondary | ICD-10-CM

## 2020-09-04 DIAGNOSIS — L570 Actinic keratosis: Secondary | ICD-10-CM | POA: Diagnosis not present

## 2020-09-04 DIAGNOSIS — L820 Inflamed seborrheic keratosis: Secondary | ICD-10-CM

## 2020-09-04 DIAGNOSIS — D1800 Hemangioma unspecified site: Secondary | ICD-10-CM

## 2020-09-04 DIAGNOSIS — D2362 Other benign neoplasm of skin of left upper limb, including shoulder: Secondary | ICD-10-CM

## 2020-09-04 DIAGNOSIS — D2271 Melanocytic nevi of right lower limb, including hip: Secondary | ICD-10-CM

## 2020-09-04 DIAGNOSIS — L821 Other seborrheic keratosis: Secondary | ICD-10-CM

## 2020-09-04 DIAGNOSIS — L304 Erythema intertrigo: Secondary | ICD-10-CM | POA: Diagnosis not present

## 2020-09-04 DIAGNOSIS — L578 Other skin changes due to chronic exposure to nonionizing radiation: Secondary | ICD-10-CM

## 2020-09-04 DIAGNOSIS — D225 Melanocytic nevi of trunk: Secondary | ICD-10-CM

## 2020-09-04 NOTE — Patient Instructions (Addendum)
Cryotherapy Aftercare  . Wash gently with soap and water everyday.   . Apply Vaseline and Band-Aid daily until healed.   Melanoma ABCDEs  Melanoma is the most dangerous type of skin cancer, and is the leading cause of death from skin disease.  You are more likely to develop melanoma if you:  Have light-colored skin, light-colored eyes, or red or blond hair  Spend a lot of time in the sun  Tan regularly, either outdoors or in a tanning bed  Have had blistering sunburns, especially during childhood  Have a close family member who has had a melanoma  Have atypical moles or large birthmarks  Early detection of melanoma is key since treatment is typically straightforward and cure rates are extremely high if we catch it early.   The first sign of melanoma is often a change in a mole or a new dark spot.  The ABCDE system is a way of remembering the signs of melanoma.  A for asymmetry:  The two halves do not match. B for border:  The edges of the growth are irregular. C for color:  A mixture of colors are present instead of an even brown color. D for diameter:  Melanomas are usually (but not always) greater than 6mm - the size of a pencil eraser. E for evolution:  The spot keeps changing in size, shape, and color.  Please check your skin once per month between visits. You can use a small mirror in front and a large mirror behind you to keep an eye on the back side or your body.   If you see any new or changing lesions before your next follow-up, please call to schedule a visit.  Please continue daily skin protection including broad spectrum sunscreen SPF 30+ to sun-exposed areas, reapplying every 2 hours as needed when you're outdoors.   Staying in the shade or wearing long sleeves, sun glasses (UVA+UVB protection) and wide brim hats (4-inch brim around the entire circumference of the hat) are also recommended for sun protection.     If you have any questions or concerns for your  doctor, please call our main line at [phone] and press option 4 to reach your doctor's medical assistant. If no one answers, please leave a voicemail as directed and we will return your call as soon as possible. Messages left after 4 pm will be answered the following business day.   You may also send us a message via MyChart. We typically respond to MyChart messages within 1-2 business days.  For prescription refills, please ask your pharmacy to contact our office. Our fax number is [phone].  If you have an urgent issue when the clinic is closed that cannot wait until the next business day, you can page your doctor at the number below.    Please note that while we do our best to be available for urgent issues outside of office hours, we are not available 24/7.   If you have an urgent issue and are unable to reach us, you may choose to seek medical care at your doctor's office, retail clinic, urgent care center, or emergency room.  If you have a medical emergency, please immediately call 911 or go to the emergency department.  Pager Numbers  - Dr. Kowalski: [phone]  - Dr. Moye: [phone]  - Dr. Stewart: [phone]  In the event of inclement weather, please call our main line at [phone] for an update on the status of any delays or closures.    Dermatology Medication Tips: Please keep the boxes that topical medications come in in order to help keep track of the instructions about where and how to use these. Pharmacies typically print the medication instructions only on the boxes and not directly on the medication tubes.   If your medication is too expensive, please contact our office at [phone] option 4 or send us a message through MyChart.   We are unable to tell what your co-pay for medications will be in advance as this is different depending on your insurance coverage. However, we may be able to find a substitute medication at lower cost or fill out paperwork  to get insurance to cover a needed medication.   If a prior authorization is required to get your medication covered by your insurance company, please allow us 1-2 business days to complete this process.  Drug prices often vary depending on where the prescription is filled and some pharmacies may offer cheaper prices.  The website www.goodrx.com contains coupons for medications through different pharmacies. The prices here do not account for what the cost may be with help from insurance (it may be cheaper with your insurance), but the website can give you the price if you did not use any insurance.  - You can print the associated coupon and take it with your prescription to the pharmacy.  - You may also stop by our office during regular business hours and pick up a GoodRx coupon card.  - If you need your prescription sent electronically to a different pharmacy, notify our office through [facility] MyChart or by phone at [phone] option 4.  

## 2020-09-04 NOTE — Progress Notes (Signed)
Follow-Up Visit   Subjective  Hannah Burke is a 65 y.o. female who presents for the following: Annual Exam (Patient here for TBSE. She has a spot on her right abdomen that came up about 5 months ago, itchy. She also has a history of AKs and BCCs.). Also itchy spot on Left cheek.   The following portions of the chart were reviewed this encounter and updated as appropriate:       Review of Systems:  No other skin or systemic complaints except as noted in HPI or Assessment and Plan.  Objective  Well appearing patient in no apparent distress; mood and affect are within normal limits.  A full examination was performed including scalp, head, eyes, ears, nose, lips, neck, chest, axillae, abdomen, back, buttocks, bilateral upper extremities, bilateral lower extremities, hands, feet, fingers, toes, fingernails, and toenails. All findings within normal limits unless otherwise noted below.  Objective  R upper abdomen x 1, L preauricular x 1 (2): Erythematous keratotic or waxy stuck-on papule or plaque.   Objective  R Inframammary: Small pink papules.  Objective  Back, L shoulder: Round white scars.  Objective  L lower shoulder x 1, R upper arm x 1 (2): Pink scaly macule adjacent to white scar on left lower shoulder; light pink macule superior to white scar on right upper arm  Objective  R lateral ankle: 48mm waxy brown papule with slight irreg border   Objective  Right Posterior Thigh: 4.25mm speckled med brown macule  Left Posterior ankle: 2.73mm med dark brown macule   Right sacrum: 4.24mm two-tone brown macule  Right Gluteal Cleft: 4.35mm dark brown thin papule   Images           Assessment & Plan   Skin cancer screening performed today.  Actinic Damage - chronic, secondary to cumulative UV radiation exposure/sun exposure over time - diffuse scaly erythematous macules with underlying dyspigmentation - Recommend daily broad spectrum sunscreen SPF 30+ to  sun-exposed areas, reapply every 2 hours as needed.  - Recommend staying in the shade or wearing long sleeves, sun glasses (UVA+UVB protection) and wide brim hats (4-inch brim around the entire circumference of the hat). - Call for new or changing lesions.  Lentigines - Scattered tan macules - Due to sun exposure - Benign-appering, observe - Recommend daily broad spectrum sunscreen SPF 30+ to sun-exposed areas, reapply every 2 hours as needed. - Call for any changes  Seborrheic Keratoses - Stuck-on, waxy, tan-brown papules and/or plaques  - Benign-appearing - Discussed benign etiology and prognosis. - Observe - Call for any changes  History of Basal Cell Carcinoma of the Skin - No evidence of recurrence today - Recommend regular full body skin exams - Recommend daily broad spectrum sunscreen SPF 30+ to sun-exposed areas, reapply every 2 hours as needed.  - Call if any new or changing lesions are noted between office visits  Hemangiomas - Red papules - Discussed benign nature - Observe - Call for any changes  Dermatofibroma - Firm pink/brown papulenodule with dimple sign of the left upper arm - Benign appearing - Call for any changes   Inflamed seborrheic keratosis (2) R upper abdomen x 1, L preauricular x 1  Prior to procedure, discussed risks of blister formation, small wound, skin dyspigmentation, or rare scar following cryotherapy.    Destruction of lesion - R upper abdomen x 1, L preauricular x 1  Destruction method: cryotherapy   Informed consent: discussed and consent obtained   Lesion destroyed using  liquid nitrogen: Yes   Region frozen until ice ball extended beyond lesion: Yes   Outcome: patient tolerated procedure well with no complications   Post-procedure details: wound care instructions given    Erythema intertrigo R Inframammary  With recurrence Intertrigo is a chronic recurrent rash that occurs in skin fold areas that may be associated with  friction; heat; moisture; yeast; fungus; and bacteria.  It is exacerbated by increased movement / activity; sweating; and higher atmospheric temperature.  Restart Nystatin Powder QD to AA. Pt has, will call for refills.  Scar Back, L shoulder  Probable EDC/cryo sites from treated BCCs vs AKs.  Clear. No evidence of recurrence.    AK (actinic keratosis) (2) L lower shoulder x 1, R upper arm x 1  Recheck on f/up  Actinic keratoses are precancerous spots that appear secondary to cumulative UV radiation exposure/sun exposure over time. They are chronic with expected duration over 1 year. A portion of actinic keratoses will progress to squamous cell carcinoma of the skin. It is not possible to reliably predict which spots will progress to skin cancer and so treatment is recommended to prevent development of skin cancer.  Recommend daily broad spectrum sunscreen SPF 30+ to sun-exposed areas, reapply every 2 hours as needed.  Recommend staying in the shade or wearing long sleeves, sun glasses (UVA+UVB protection) and wide brim hats (4-inch brim around the entire circumference of the hat). Call for new or changing lesions.  Prior to procedure, discussed risks of blister formation, small wound, skin dyspigmentation, or rare scar following cryotherapy.    Destruction of lesion - L lower shoulder x 1, R upper arm x 1  Destruction method: cryotherapy   Informed consent: discussed and consent obtained   Lesion destroyed using liquid nitrogen: Yes   Region frozen until ice ball extended beyond lesion: Yes   Outcome: patient tolerated procedure well with no complications   Post-procedure details: wound care instructions given    Seborrheic keratosis R lateral ankle  Benign, observe.    Nevus (4) Right Gluteal Cleft; Right Posterior Thigh; Left Posterior ankle; Right sacrum  Benign-appearing.  Observation.  Call clinic for new or changing moles.  Recommend daily use of broad spectrum spf  30+ sunscreen to sun-exposed areas.   Recheck on f/up  Return in about 6 months (around 03/07/2021) for AKs, recheck measured moles.   IJamesetta Orleans, CMA, am acting as scribe for Brendolyn Patty, MD .  Documentation: I have reviewed the above documentation for accuracy and completeness, and I agree with the above.  Brendolyn Patty MD

## 2020-11-23 ENCOUNTER — Other Ambulatory Visit: Payer: Self-pay | Admitting: Obstetrics and Gynecology

## 2020-11-23 DIAGNOSIS — Z1231 Encounter for screening mammogram for malignant neoplasm of breast: Secondary | ICD-10-CM

## 2020-12-26 ENCOUNTER — Other Ambulatory Visit: Payer: Self-pay

## 2020-12-26 ENCOUNTER — Ambulatory Visit
Admission: RE | Admit: 2020-12-26 | Discharge: 2020-12-26 | Disposition: A | Discharge status: Home / Self Care | Payer: Medicare Other | Source: Ambulatory Visit | Attending: Obstetrics and Gynecology | Admitting: Obstetrics and Gynecology

## 2020-12-26 DIAGNOSIS — Z1231 Encounter for screening mammogram for malignant neoplasm of breast: Secondary | ICD-10-CM | POA: Diagnosis present

## 2021-03-06 ENCOUNTER — Ambulatory Visit (INDEPENDENT_AMBULATORY_CARE_PROVIDER_SITE_OTHER): Payer: Medicare Other | Admitting: Dermatology

## 2021-03-06 ENCOUNTER — Other Ambulatory Visit: Payer: Self-pay

## 2021-03-06 DIAGNOSIS — D2272 Melanocytic nevi of left lower limb, including hip: Secondary | ICD-10-CM | POA: Diagnosis not present

## 2021-03-06 DIAGNOSIS — L578 Other skin changes due to chronic exposure to nonionizing radiation: Secondary | ICD-10-CM

## 2021-03-06 DIAGNOSIS — D229 Melanocytic nevi, unspecified: Secondary | ICD-10-CM

## 2021-03-06 DIAGNOSIS — L814 Other melanin hyperpigmentation: Secondary | ICD-10-CM

## 2021-03-06 DIAGNOSIS — L820 Inflamed seborrheic keratosis: Secondary | ICD-10-CM | POA: Diagnosis not present

## 2021-03-06 DIAGNOSIS — L821 Other seborrheic keratosis: Secondary | ICD-10-CM | POA: Diagnosis not present

## 2021-03-06 DIAGNOSIS — D225 Melanocytic nevi of trunk: Secondary | ICD-10-CM | POA: Diagnosis not present

## 2021-03-06 DIAGNOSIS — D2362 Other benign neoplasm of skin of left upper limb, including shoulder: Secondary | ICD-10-CM

## 2021-03-06 DIAGNOSIS — D2271 Melanocytic nevi of right lower limb, including hip: Secondary | ICD-10-CM

## 2021-03-06 NOTE — Patient Instructions (Signed)
Seborrheic Keratosis  What causes seborrheic keratoses? Seborrheic keratoses are harmless, common skin growths that first appear during adult life.  As time goes by, more growths appear.  Some people may develop a large number of them.  Seborrheic keratoses appear on both covered and uncovered body parts.  They are not caused by sunlight.  The tendency to develop seborrheic keratoses can be inherited.  They vary in color from skin-colored to gray, brown, or even black.  They can be either smooth or have a rough, warty surface.   Seborrheic keratoses are superficial and look as if they were stuck on the skin.  Under the microscope this type of keratosis looks like layers upon layers of skin.  That is why at times the top layer may seem to fall off, but the rest of the growth remains and re-grows.    Treatment Seborrheic keratoses do not need to be treated, but can easily be removed in the office.  Seborrheic keratoses often cause symptoms when they rub on clothing or jewelry.  Lesions can be in the way of shaving.  If they become inflamed, they can cause itching, soreness, or burning.  Removal of a seborrheic keratosis can be accomplished by freezing, burning, or surgery. If any spot bleeds, scabs, or grows rapidly, please return to have it checked, as these can be an indication of a skin cancer.  Melanoma ABCDEs  Melanoma is the most dangerous type of skin cancer, and is the leading cause of death from skin disease.  You are more likely to develop melanoma if you: Have light-colored skin, light-colored eyes, or red or blond hair Spend a lot of time in the sun Tan regularly, either outdoors or in a tanning bed Have had blistering sunburns, especially during childhood Have a close family member who has had a melanoma Have atypical moles or large birthmarks  Early detection of melanoma is key since treatment is typically straightforward and cure rates are extremely high if we catch it early.   The  first sign of melanoma is often a change in a mole or a new dark spot.  The ABCDE system is a way of remembering the signs of melanoma.  A for asymmetry:  The two halves do not match. B for border:  The edges of the growth are irregular. C for color:  A mixture of colors are present instead of an even brown color. D for diameter:  Melanomas are usually (but not always) greater than 6mm - the size of a pencil eraser. E for evolution:  The spot keeps changing in size, shape, and color.  Please check your skin once per month between visits. You can use a small mirror in front and a large mirror behind you to keep an eye on the back side or your body.   If you see any new or changing lesions before your next follow-up, please call to schedule a visit.  Please continue daily skin protection including broad spectrum sunscreen SPF 30+ to sun-exposed areas, reapplying every 2 hours as needed when you're outdoors.   Staying in the shade or wearing long sleeves, sun glasses (UVA+UVB protection) and wide brim hats (4-inch brim around the entire circumference of the hat) are also recommended for sun protection.    If you have any questions or concerns for your doctor, please call our main line at [phone] and press option 4 to reach your doctor's medical assistant. If no one answers, please leave a voicemail as directed and   we will return your call as soon as possible. Messages left after 4 pm will be answered the following business day.   You may also send us a message via MyChart. We typically respond to MyChart messages within 1-2 business days.  For prescription refills, please ask your pharmacy to contact our office. Our fax number is [phone].  If you have an urgent issue when the clinic is closed that cannot wait until the next business day, you can page your doctor at the number below.    Please note that while we do our best to be available for urgent issues outside of office hours, we  are not available 24/7.   If you have an urgent issue and are unable to reach us, you may choose to seek medical care at your doctor's office, retail clinic, urgent care center, or emergency room.  If you have a medical emergency, please immediately call 911 or go to the emergency department.  Pager Numbers  - Dr. Kowalski: [phone]  - Dr. Moye: [phone]  - Dr. Stewart: [phone]  In the event of inclement weather, please call our main line at [phone] for an update on the status of any delays or closures.  Dermatology Medication Tips: Please keep the boxes that topical medications come in in order to help keep track of the instructions about where and how to use these. Pharmacies typically print the medication instructions only on the boxes and not directly on the medication tubes.   If your medication is too expensive, please contact our office at [phone] option 4 or send us a message through MyChart.   We are unable to tell what your co-pay for medications will be in advance as this is different depending on your insurance coverage. However, we may be able to find a substitute medication at lower cost or fill out paperwork to get insurance to cover a needed medication.   If a prior authorization is required to get your medication covered by your insurance company, please allow us 1-2 business days to complete this process.  Drug prices often vary depending on where the prescription is filled and some pharmacies may offer cheaper prices.  The website www.goodrx.com contains coupons for medications through different pharmacies. The prices here do not account for what the cost may be with help from insurance (it may be cheaper with your insurance), but the website can give you the price if you did not use any insurance.  - You can print the associated coupon and take it with your prescription to the pharmacy.  - You may also stop by our office during regular business  hours and pick up a GoodRx coupon card.  - If you need your prescription sent electronically to a different pharmacy, notify our office through [facility] MyChart or by phone at [phone] option 4.  

## 2021-03-06 NOTE — Progress Notes (Signed)
   Follow-Up Visit   Subjective  Hannah Burke is a 65 y.o. female who presents for the following: Follow-up (Patient here today for 6 month follow up. Reports no new spots of concern. ).  Pt has moles to recheck today.  She has h/o BCC    The following portions of the chart were reviewed this encounter and updated as appropriate:      Review of Systems: No other skin or systemic complaints except as noted in HPI or Assessment and Plan.   Objective  Well appearing patient in no apparent distress; mood and affect are within normal limits.  A focused examination was performed including face, back, legs, chest, and arms. Relevant physical exam findings are noted in the Assessment and Plan.  spinal lower back x 1 Erythematous keratotic or waxy stuck-on papule   Right Lateral Ankle x 1 7 mm waxy brown papule with slight irregular border  Left Forehead x 1 4 mm pink flesh waxy papule  right posterior thigh 4 mm speckled medium brown macule   Left Ankle - Posterior 2 mm  medium dark brown macule   right sacrum 4 mm two toned brown macule   right gluteal cleft 4 mm dark brown thin papule   Assessment & Plan  Inflamed seborrheic keratosis spinal lower back x 1  Reassured benign age-related growth.  Recommend observation.  Discussed cryotherapy if spot(s) become irritated or inflamed.  Patient defers Ln2 at this time  Seborrheic keratosis (2) Right Lateral Ankle x 1; Left Forehead x 1  Reassured benign age-related growth.  Recommend observation.  Discussed cryotherapy if spot(s) become irritated or inflamed.  Nevus (4) Left Ankle - Posterior; right sacrum; right posterior thigh; right gluteal cleft  Benign-appearing. Stable. Observation.  Call clinic for new or changing lesions.  Recommend daily use of broad spectrum spf 30+ sunscreen to sun-exposed areas.   Compared with prior photos no changes   Seborrheic Keratoses - Stuck-on, waxy, tan-brown papules and/or  plaques  - Benign-appearing - Discussed benign etiology and prognosis. - Observe - Call for any changes  Lentigines - Scattered tan macules - Due to sun exposure - Benign-appering, observe - Recommend daily broad spectrum sunscreen SPF 30+ to sun-exposed areas, reapply every 2 hours as needed. - Call for any changes  Dermatofibroma - Firm pink/brown papulenodule with dimple sign of the left upper arm - Benign appearing - Call for any changes  Actinic Damage - chronic, secondary to cumulative UV radiation exposure/sun exposure over time - diffuse scaly erythematous macules with underlying dyspigmentation - Recommend daily broad spectrum sunscreen SPF 30+ to sun-exposed areas, reapply every 2 hours as needed.  - Recommend staying in the shade or wearing long sleeves, sun glasses (UVA+UVB protection) and wide brim hats (4-inch brim around the entire circumference of the hat). - Call for new or changing lesions.  History of Basal Cell Carcinoma of the Skin - No evidence of recurrence today R upper arm - Recommend regular full body skin exams - Recommend daily broad spectrum sunscreen SPF 30+ to sun-exposed areas, reapply every 2 hours as needed.  - Call if any new or changing lesions are noted between office visits   Return for 6 month skin check recheck moles, h/o BCC. I, Ruthell Rummage, CMA, am acting as scribe for Brendolyn Patty, MD.  Documentation: I have reviewed the above documentation for accuracy and completeness, and I agree with the above.  Brendolyn Patty MD

## 2021-09-04 ENCOUNTER — Ambulatory Visit (INDEPENDENT_AMBULATORY_CARE_PROVIDER_SITE_OTHER): Payer: Medicare Other | Admitting: Dermatology

## 2021-09-04 DIAGNOSIS — D225 Melanocytic nevi of trunk: Secondary | ICD-10-CM | POA: Diagnosis not present

## 2021-09-04 DIAGNOSIS — I781 Nevus, non-neoplastic: Secondary | ICD-10-CM

## 2021-09-04 DIAGNOSIS — D2271 Melanocytic nevi of right lower limb, including hip: Secondary | ICD-10-CM

## 2021-09-04 DIAGNOSIS — L570 Actinic keratosis: Secondary | ICD-10-CM

## 2021-09-04 DIAGNOSIS — Z1283 Encounter for screening for malignant neoplasm of skin: Secondary | ICD-10-CM | POA: Diagnosis not present

## 2021-09-04 DIAGNOSIS — L820 Inflamed seborrheic keratosis: Secondary | ICD-10-CM

## 2021-09-04 DIAGNOSIS — D485 Neoplasm of uncertain behavior of skin: Secondary | ICD-10-CM

## 2021-09-04 DIAGNOSIS — D229 Melanocytic nevi, unspecified: Secondary | ICD-10-CM

## 2021-09-04 DIAGNOSIS — L578 Other skin changes due to chronic exposure to nonionizing radiation: Secondary | ICD-10-CM

## 2021-09-04 DIAGNOSIS — D2272 Melanocytic nevi of left lower limb, including hip: Secondary | ICD-10-CM

## 2021-09-04 DIAGNOSIS — D1800 Hemangioma unspecified site: Secondary | ICD-10-CM

## 2021-09-04 DIAGNOSIS — Z85828 Personal history of other malignant neoplasm of skin: Secondary | ICD-10-CM

## 2021-09-04 DIAGNOSIS — L821 Other seborrheic keratosis: Secondary | ICD-10-CM

## 2021-09-04 DIAGNOSIS — D239 Other benign neoplasm of skin, unspecified: Secondary | ICD-10-CM

## 2021-09-04 DIAGNOSIS — L814 Other melanin hyperpigmentation: Secondary | ICD-10-CM

## 2021-09-04 DIAGNOSIS — D2362 Other benign neoplasm of skin of left upper limb, including shoulder: Secondary | ICD-10-CM

## 2021-09-04 HISTORY — DX: Other benign neoplasm of skin, unspecified: D23.9

## 2021-09-04 MED ORDER — FLUOROURACIL 5 % EX CREA: TOPICAL_CREAM | Freq: Two times a day (BID) | CUTANEOUS | 1 refills | Status: AC

## 2021-09-04 NOTE — Progress Notes (Signed)
? ?Follow-Up Visit ?  ?Subjective  ?Hannah Burke is a 66 y.o. female who presents for the following: Annual Exam (The patient presents for Total-Body Skin Exam (TBSE) for skin cancer screening and mole check.  The patient has spots, moles and lesions to be evaluated, some may be new or changing and the patient has concerns that these could be cancer. Patient with hx of BCC. Patient advises she does have a spot at left forehead that she noticed about 2 months ago that has gotten larger.). Stays rough and scaly. ? ? ?The following portions of the chart were reviewed this encounter and updated as appropriate:  ?  ?  ? ?Review of Systems:  No other skin or systemic complaints except as noted in HPI or Assessment and Plan. ? ?Objective  ?Well appearing patient in no apparent distress; mood and affect are within normal limits. ? ?A full examination was performed including scalp, head, eyes, ears, nose, lips, neck, chest, axillae, abdomen, back, buttocks, bilateral upper extremities, bilateral lower extremities, hands, feet, fingers, toes, fingernails, and toenails. All findings within normal limits unless otherwise noted below. ? ?Left Ankle - Posterior; right posterior thigh; right gluteal cleft ?right medial posterior thigh ?4 mm speckled medium brown macule  ?  ?Left Ankle - Posterior ?2 mm medium dark brown macule  ?   ?right gluteal cleft ?4 mm dark brown thin papule  ? ?Photos compared, no changes ? ?Right Lateral Ankle x 1 ?7 mm waxy brown papule with slight irregular border ?  ? ? ?left forehead ?7 x 5 mm pink flesh waxy papule ? ?right anterior alar rim ?2 mm blanching red macule ? ?right sacrum ?4 mm two toned brown macule  ? ? ? ? ? ? ?Assessment & Plan  ?Nevus ?Left Ankle - Posterior; right posterior thigh; right gluteal cleft ? ?Benign-appearing.  Stable. Observation.  Call clinic for new or changing lesions.  Recommend daily use of broad spectrum spf 30+ sunscreen to sun-exposed areas.  ? ? ?Seborrheic  keratosis ?Right Lateral Ankle x 1 ? ?Benign-appearing.  Stable. Observation.  Call clinic for new or changing lesions.  Recommend daily use of broad spectrum spf 30+ sunscreen to sun-exposed areas.  ? ? ?Inflamed seborrheic keratosis ?left forehead ? ?Destruction of lesion - left forehead ? ?Destruction method: cryotherapy   ?Informed consent: discussed and consent obtained   ?Lesion destroyed using liquid nitrogen: Yes   ?Region frozen until ice ball extended beyond lesion: Yes   ?Outcome: patient tolerated procedure well with no complications   ?Post-procedure details: wound care instructions given   ?Additional details:  Prior to procedure, discussed risks of blister formation, small wound, skin dyspigmentation, or rare scar following cryotherapy. Recommend Vaseline ointment to treated areas while healing.  ? ?Telangiectasia ?right anterior alar rim ? ?Neoplasm of uncertain behavior of skin ?right sacrum ? ?Epidermal / dermal shaving ? ?Lesion diameter (cm):  0.4 ?Informed consent: discussed and consent obtained   ?Patient was prepped and draped in usual sterile fashion: Area prepped with alcohol. ?Anesthesia: the lesion was anesthetized in a standard fashion   ?Anesthetic:  1% lidocaine w/ epinephrine 1-100,000 buffered w/ 8.4% NaHCO3 ?Instrument used: flexible razor blade   ?Hemostasis achieved with: pressure, aluminum chloride and electrodesiccation   ?Outcome: patient tolerated procedure well   ?Post-procedure details: wound care instructions given   ?Post-procedure details comment:  Ointment and small bandage applied.  ? ?Specimen 1 - Surgical pathology ?Differential Diagnosis: Nevus r/o dysplasia ? ?Check Margins: No ?  4 mm two toned brown macule ? ? ?Lentigines ?- Scattered tan macules ?- Due to sun exposure ?- Benign-appearing, observe ?- Recommend daily broad spectrum sunscreen SPF 30+ to sun-exposed areas, reapply every 2 hours as needed. ?- Call for any changes ? ?Seborrheic Keratoses ?- Stuck-on,  waxy, tan-brown papules and/or plaques  ?- Benign-appearing ?- Discussed benign etiology and prognosis. ?- Observe ?- Call for any changes ? ?Melanocytic Nevi ?- Tan-brown and/or pink-flesh-colored symmetric macules and papules ?- Benign appearing on exam today ?- Observation ?- Call clinic for new or changing moles ?- Recommend daily use of broad spectrum spf 30+ sunscreen to sun-exposed areas.  ? ?Hemangiomas ?- Red papules ?- Discussed benign nature ?- Observe ?- Call for any changes ? ?Actinic Damage - Severe, confluent actinic changes with pre-cancerous actinic keratoses  ?- Severe, chronic, not at goal, secondary to cumulative UV radiation exposure over time ?- diffuse scaly erythematous macules and papules with underlying dyspigmentation ?- Discussed Prescription "Field Treatment" for Severe, Chronic Confluent Actinic Changes with Pre-Cancerous Actinic Keratoses ?Field treatment involves treatment of an entire area of skin that has confluent Actinic Changes (Sun/ Ultraviolet light damage) and PreCancerous Actinic Keratoses by method of PhotoDynamic Therapy (PDT) and/or prescription Topical Chemotherapy agents such as 5-fluorouracil, 5-fluorouracil/calcipotriene, and/or imiquimod.  The purpose is to decrease the number of clinically evident and subclinical PreCancerous lesions to prevent progression to development of skin cancer by chemically destroying early precancer changes that may or may not be visible.  It has been shown to reduce the risk of developing skin cancer in the treated area. As a result of treatment, redness, scaling, crusting, and open sores may occur during treatment course. One or more than one of these methods may be used and may have to be used several times to control, suppress and eliminate the PreCancerous changes. Discussed treatment course, expected reaction, and possible side effects. ?- Recommend daily broad spectrum sunscreen SPF 30+ to sun-exposed areas, reapply every 2 hours as  needed.  ?- Staying in the shade or wearing long sleeves, sun glasses (UVA+UVB protection) and wide brim hats (4-inch brim around the entire circumference of the hat) are also recommended. ?- Call for new or changing lesions. ? ?Start 5-fluorouracil/calcipotriene cream twice a day for 7 days to affected areas including face, 10-14 days to chest. Prescription sent to [facility]. Patient provided with contact information for pharmacy and advised the pharmacy will mail the prescription to their home. Patient provided with handout reviewing treatment course and side effects and advised to call or message [place] on MyChart with any concerns. ? ? ?Skin cancer screening performed today. ? ?History of Basal Cell Carcinoma of the Skin ?- No evidence of recurrence today ?- Recommend regular full body skin exams ?- Recommend daily broad spectrum sunscreen SPF 30+ to sun-exposed areas, reapply every 2 hours as needed.  ?- Call if any new or changing lesions are noted between office visits ? ?Dermatofibroma ?- Firm pink/brown papulenodule with dimple sign at left upper arm ?- Benign appearing ?- Call for any changes ? ?Return in about 6 months (around 03/07/2022) for AK follow up. ? ?Graciella Belton, RMA, am acting as scribe for Brendolyn Patty, MD . ? ?Documentation: I have reviewed the above documentation for accuracy and completeness, and I agree with the above. ? ?Brendolyn Patty MD  ? ?

## 2021-09-04 NOTE — Patient Instructions (Addendum)
Cryotherapy Aftercare ? ?Wash gently with soap and water everyday.   ?Apply Vaseline and Band-Aid daily until healed.  ? ? ?Wound Care Instructions ? ?Cleanse wound gently with soap and water once a day then pat dry with clean gauze. Apply a thing coat of Petrolatum (petroleum jelly, "Vaseline") over the wound (unless you have an allergy to this). We recommend that you use a new, sterile tube of Vaseline. Do not pick or remove scabs. Do not remove the yellow or white "healing tissue" from the base of the wound. ? ?Cover the wound with fresh, clean, nonstick gauze and secure with paper tape. You may use Band-Aids in place of gauze and tape if the would is small enough, but would recommend trimming much of the tape off as there is often too much. Sometimes Band-Aids can irritate the skin. ? ?You should call the office for your biopsy report after 1 week if you have not already been contacted. ? ?If you experience any problems, such as abnormal amounts of bleeding, swelling, significant bruising, significant pain, or evidence of infection, please call the office immediately. ? ?FOR ADULT SURGERY PATIENTS: If you need something for pain relief you may take 1 extra strength Tylenol (acetaminophen) AND 2 Ibuprofen ('200mg'$  each) together every 4 hours as needed for pain. (do not take these if you are allergic to them or if you have a reason you should not take them.) Typically, you may only need pain medication for 1 to 3 days.  ? ?Start 5-fluorouracil/calcipotriene cream twice a day for 7 days to affected areas including face, 10-14 days to chest. Prescription sent to [facility]. Patient provided with contact information for pharmacy and advised the pharmacy will mail the prescription to their home. Patient provided with handout reviewing treatment course and side effects and advised to call or message [place] on MyChart with any concerns. ? ?5-Fluorouracil/Calcipotriene Patient Education  ? ?Actinic keratoses are the  dry, red scaly spots on the skin caused by sun damage. A portion of these spots can turn into skin cancer with time, and treating them can help prevent development of skin cancer.  ? ?Treatment of these spots requires removal of the defective skin cells. There are various ways to remove actinic keratoses, including freezing with liquid nitrogen, treatment with creams, or treatment with a blue light procedure in the office.  ? ?5-fluorouracil cream is a topical cream used to treat actinic keratoses. It works by interfering with the growth of abnormal fast-growing skin cells, such as actinic keratoses. These cells peel off and are replaced by healthy ones.  ? ?5-fluorouracil/calcipotriene is a combination of the 5-fluorouracil cream with a vitamin D analog cream called calcipotriene. The calcipotriene alone does not treat actinic keratoses. However, when it is combined with 5-fluorouracil, it helps the 5-fluorouracil treat the actinic keratoses much faster so that the same results can be achieved with a much shorter treatment time. ? ?INSTRUCTIONS FOR 5-FLUOROURACIL/CALCIPOTRIENE CREAM:  ? ?5-fluorouracil/calcipotriene cream typically only needs to be used for 4-7 days. A thin layer should be applied twice a day to the treatment areas recommended by your physician.  ? ?If your physician prescribed you separate tubes of 5-fluourouracil and calcipotriene, apply a thin layer of 5-fluorouracil followed by a thin layer of calcipotriene.  ? ?Avoid contact with your eyes, nostrils, and mouth. Do not use 5-fluorouracil/calcipotriene cream on infected or open wounds.  ? ?You will develop redness, irritation and some crusting at areas where you have pre-cancer damage/actinic keratoses. IF  YOU DEVELOP PAIN, BLEEDING, OR SIGNIFICANT CRUSTING, STOP THE TREATMENT EARLY - you have already gotten a good response and the actinic keratoses should clear up well. ? ?Wash your hands after applying 5-fluorouracil 5% cream on your skin.   ? ?A moisturizer or sunscreen with a minimum SPF 30 should be applied each morning.  ? ?Once you have finished the treatment, you can apply a thin layer of Vaseline twice a day to irritated areas to soothe and calm the areas more quickly. If you experience significant discomfort, contact your physician. ? ?For some patients it is necessary to repeat the treatment for best results. ? ?SIDE EFFECTS: When using 5-fluorouracil/calcipotriene cream, you may have mild irritation, such as redness, dryness, swelling, or a mild burning sensation. This usually resolves within 2 weeks. The more actinic keratoses you have, the more redness and inflammation you can expect during treatment. Eye irritation has been reported rarely. If this occurs, please let us know.  ?If you have any trouble using this cream, please call the office. If you have any other questions about this information, please do not hesitate to ask me before you leave the office. ? ?Melanoma ABCDEs ? ?Melanoma is the most dangerous type of skin cancer, and is the leading cause of death from skin disease.  You are more likely to develop melanoma if you: ?Have light-colored skin, light-colored eyes, or red or blond hair ?Spend a lot of time in the sun ?Tan regularly, either outdoors or in a tanning bed ?Have had blistering sunburns, especially during childhood ?Have a close family member who has had a melanoma ?Have atypical moles or large birthmarks ? ?Early detection of melanoma is key since treatment is typically straightforward and cure rates are extremely high if we catch it early.  ? ?The first sign of melanoma is often a change in a mole or a new dark spot.  The ABCDE system is a way of remembering the signs of melanoma. ? ?A for asymmetry:  The two halves do not match. ?B for border:  The edges of the growth are irregular. ?C for color:  A mixture of colors are present instead of an even brown color. ?D for diameter:  Melanomas are usually (but not  always) greater than 44m - the size of a pencil eraser. ?E for evolution:  The spot keeps changing in size, shape, and color. ? ?Please check your skin once per month between visits. You can use a small mirror in front and a large mirror behind you to keep an eye on the back side or your body.  ? ?If you see any new or changing lesions before your next follow-up, please call to schedule a visit. ? ?Please continue daily skin protection including broad spectrum sunscreen SPF 30+ to sun-exposed areas, reapplying every 2 hours as needed when you're outdoors.   ? ?If You Need Anything After Your Visit ? ?If you have any questions or concerns for your doctor, please call our main line at [phone] press option 4 to reach your doctor's medical assistant. If no one answers, please leave a voicemail as directed and we will return your call as soon as possible. Messages left after 4 pm will be answered the following business day.  ? ?You may also send [place] message via MyChart. We typically respond to MyChart messages within 1-2 business days. ? ?For prescription refills, please ask your pharmacy to contact our office. Our fax number is [phone] ? ?If you have  an urgent issue when the clinic is closed that cannot wait until the next business day, you can page your doctor at the number below.   ? ?Please note that while we do our best to be available for urgent issues outside of office hours, we are not available 24/7.  ? ?If you have an urgent issue and are unable to reach [place], you may choose to seek medical care at your doctor's office, retail clinic, urgent care center, or emergency room. ? ?If you have a medical emergency, please immediately call 911 or go to the emergency department. ? ?Pager Numbers ? ?- Dr. Nehemiah Massed: [phone] ? ?- Dr. Laurence Ferrari: [phone] ? ?- Dr. Nicole Kindred: [phone] ? ?In the event of inclement weather, please call our main line at [phone] for an update on the status of any delays  or closures. ? ?Dermatology Medication Tips: ?Please keep the boxes that topical medications come in in order to help keep track of the instructions about where and how to use these. Pharmacies typically print th

## 2021-09-06 ENCOUNTER — Telehealth: Payer: Self-pay

## 2021-09-06 NOTE — Telephone Encounter (Signed)
Advised pt of bx results/sh ?

## 2021-09-06 NOTE — Telephone Encounter (Signed)
-----   Message from Brendolyn Patty, MD sent at 09/05/2021  4:28 PM EDT ----- ?Skin , right sacrum ?DYSPLASTIC COMPOUND NEVUS WITH MODERATE ATYPIA, PERIPHERAL MARGIN INVOLVED ? ?Moderately atypical mole, observation ? ? - please call patient ?

## 2021-10-03 IMAGING — MG MM DIGITAL SCREENING BILAT W/ TOMO AND CAD
8 series · 9 of 24 positions shown · non-contrast
Comparison: Previous exam(s).

CLINICAL DATA: Screening.

EXAM:
DIGITAL SCREENING BILATERAL MAMMOGRAM WITH TOMOSYNTHESIS AND CAD
TECHNIQUE: Bilateral screening digital craniocaudal and mediolateral oblique
mammograms were obtained. Bilateral screening digital breast
tomosynthesis was performed. The images were evaluated with
computer-aided detection.

[L MLO synth-2D]
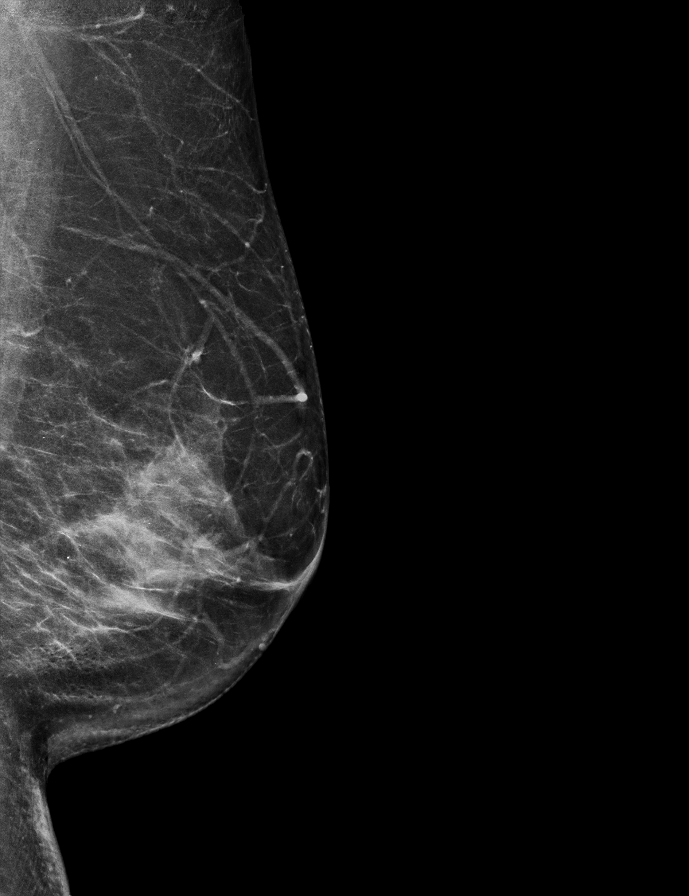

[R CC synth-2D]
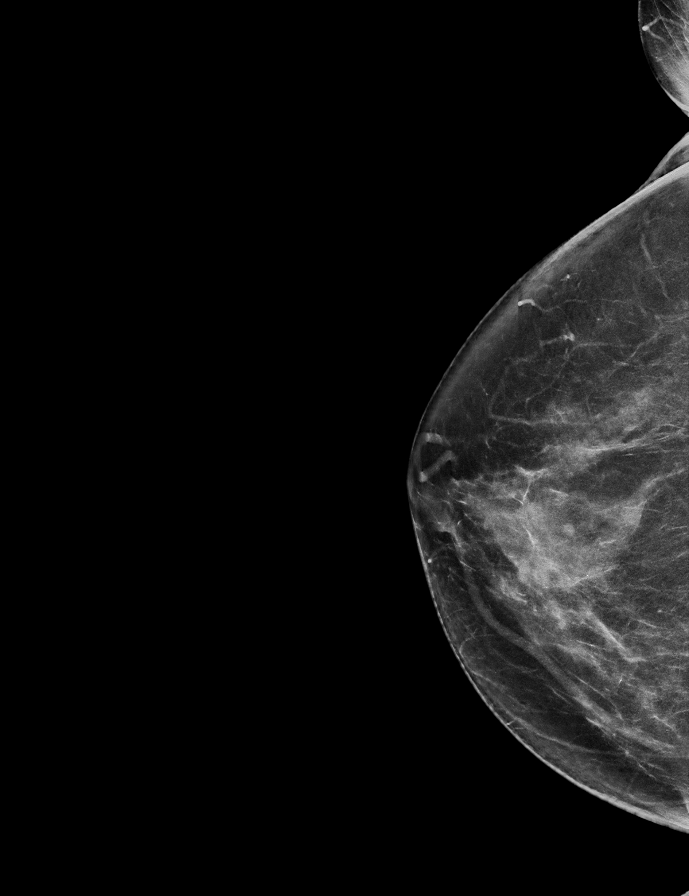

[L CC synth-2D]
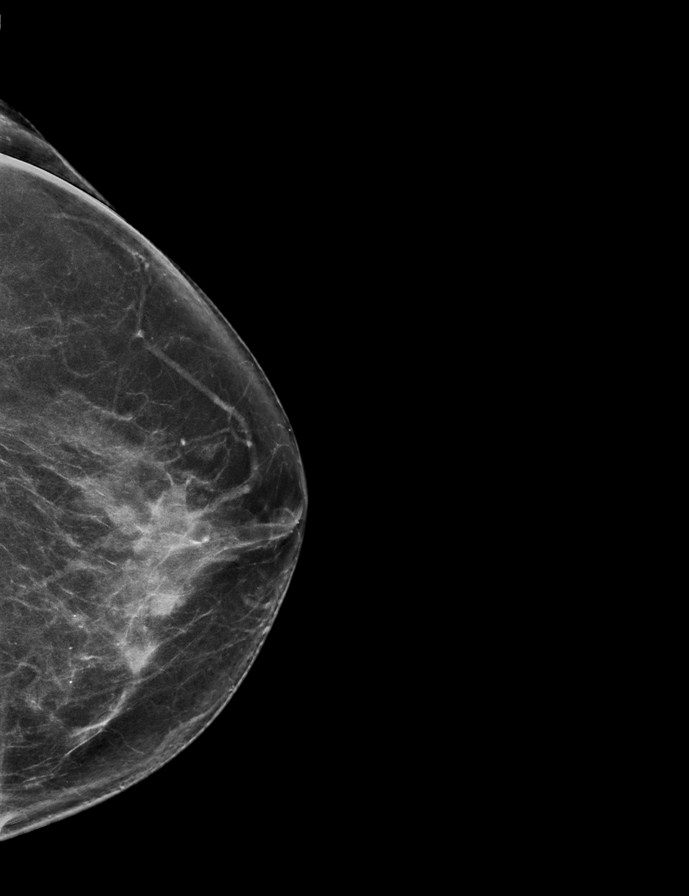

[R MLO synth-2D]
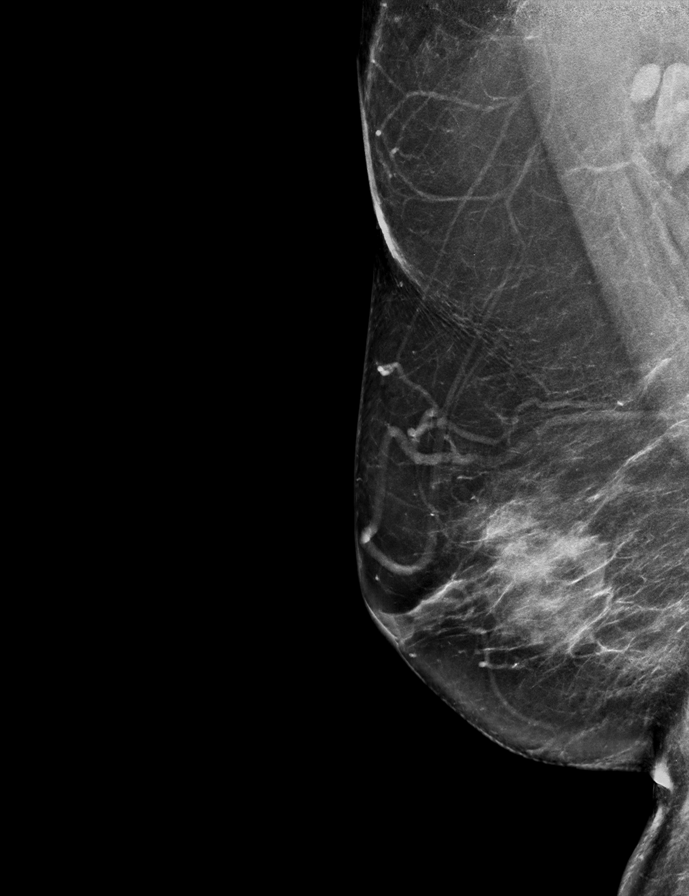

[R CC tomo · 2 of 67 frames shown]
[frame 22/67]
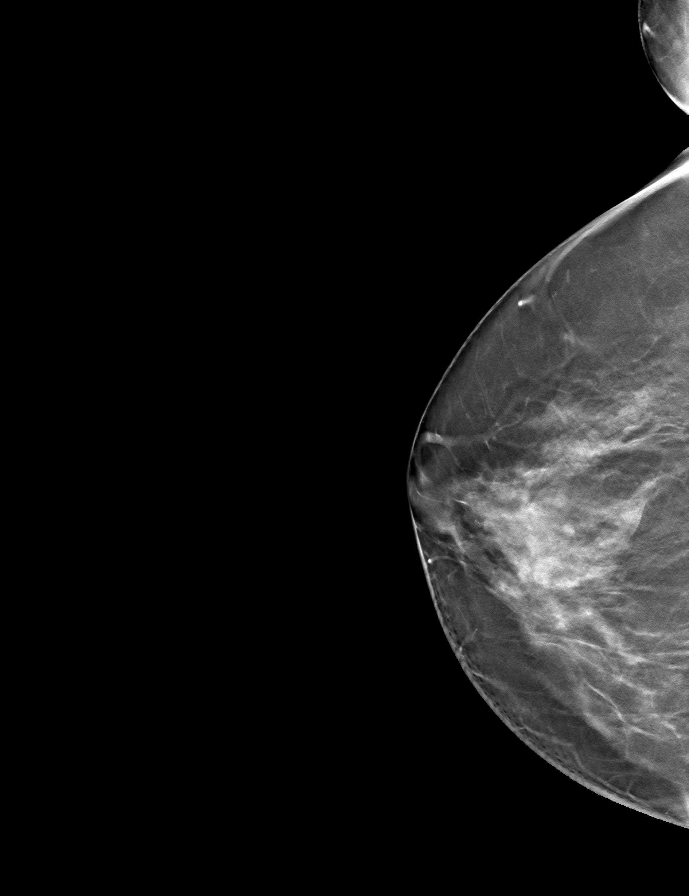
[frame 34/67]
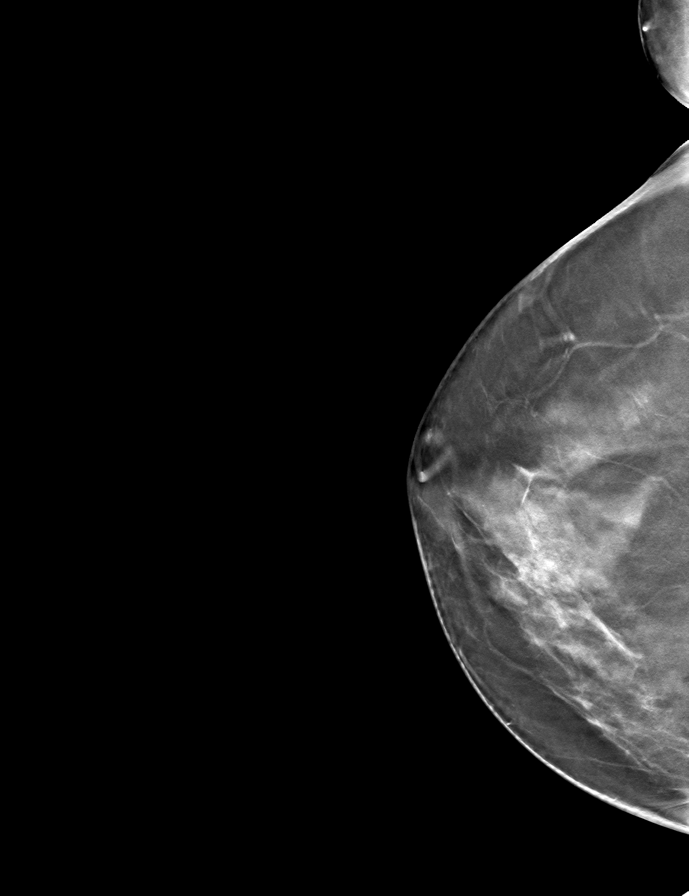

[L CC tomo · tomo slice 35/70.0]
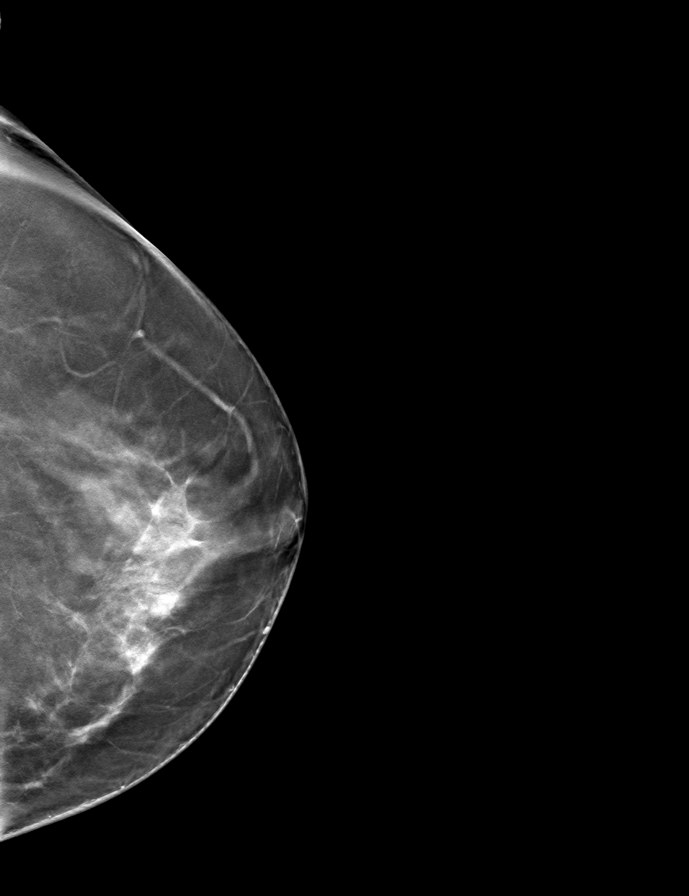

[R MLO tomo · tomo slice 37/72.0]
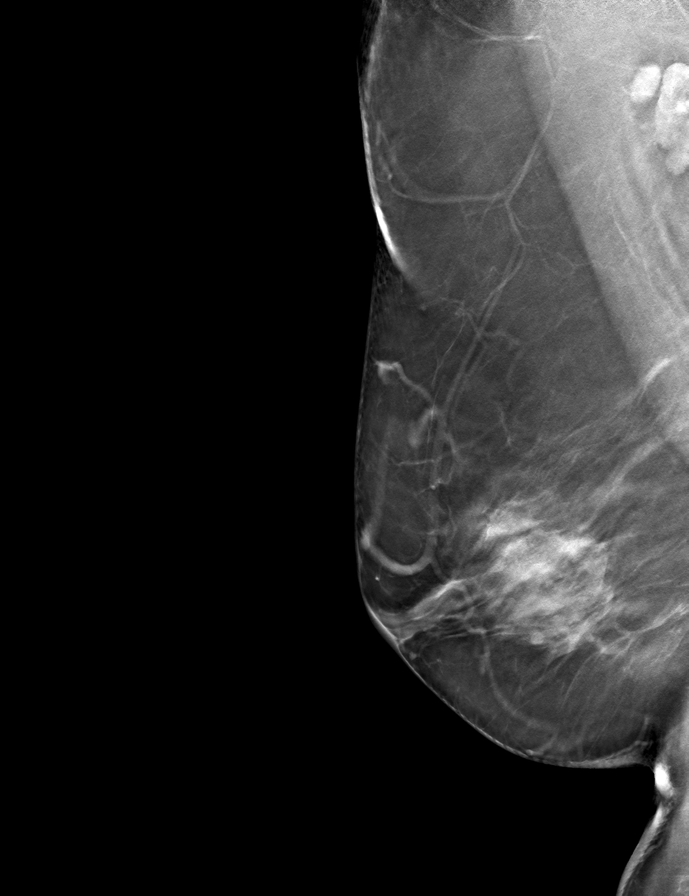

[L MLO tomo · tomo slice 34/67.0]
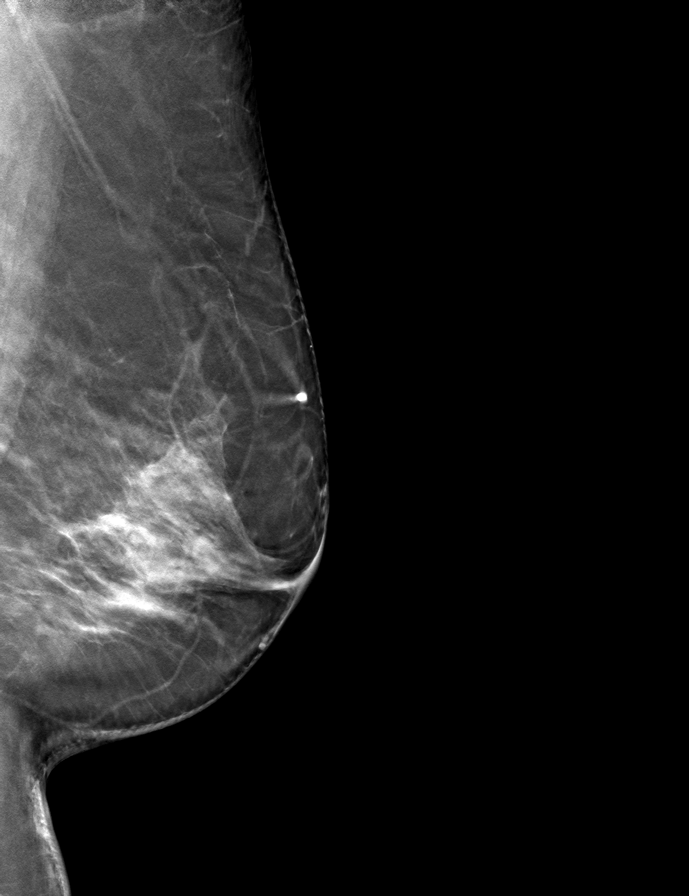

[9 of 24 positions shown; findings below may reference images not displayed]

ACR Breast Density Category c: The breast tissue is heterogeneously
dense, which may obscure small masses.
FINDINGS: There are no findings suspicious for malignancy.
IMPRESSION: No mammographic evidence of malignancy. A result letter of this
screening mammogram will be mailed directly to the patient.

RECOMMENDATION:
Screening mammogram in one year. (Code:Q3-W-BC3)

BI-RADS CATEGORY  1: Negative.

## 2021-12-18 ENCOUNTER — Other Ambulatory Visit: Payer: Self-pay | Admitting: Obstetrics and Gynecology

## 2021-12-18 DIAGNOSIS — Z1231 Encounter for screening mammogram for malignant neoplasm of breast: Secondary | ICD-10-CM

## 2022-01-08 ENCOUNTER — Ambulatory Visit
Admission: RE | Admit: 2022-01-08 | Discharge: 2022-01-08 | Disposition: A | Discharge status: Home / Self Care | Payer: Medicare Other | Source: Ambulatory Visit | Attending: Obstetrics and Gynecology | Admitting: Obstetrics and Gynecology

## 2022-01-08 DIAGNOSIS — Z1231 Encounter for screening mammogram for malignant neoplasm of breast: Secondary | ICD-10-CM | POA: Insufficient documentation

## 2022-03-12 ENCOUNTER — Ambulatory Visit (INDEPENDENT_AMBULATORY_CARE_PROVIDER_SITE_OTHER): Payer: Medicare Other | Admitting: Dermatology

## 2022-03-12 DIAGNOSIS — Z85828 Personal history of other malignant neoplasm of skin: Secondary | ICD-10-CM | POA: Diagnosis not present

## 2022-03-12 DIAGNOSIS — L578 Other skin changes due to chronic exposure to nonionizing radiation: Secondary | ICD-10-CM | POA: Diagnosis not present

## 2022-03-12 DIAGNOSIS — Z86018 Personal history of other benign neoplasm: Secondary | ICD-10-CM

## 2022-03-12 DIAGNOSIS — D485 Neoplasm of uncertain behavior of skin: Secondary | ICD-10-CM

## 2022-03-12 DIAGNOSIS — C44612 Basal cell carcinoma of skin of right upper limb, including shoulder: Secondary | ICD-10-CM | POA: Diagnosis not present

## 2022-03-12 DIAGNOSIS — L570 Actinic keratosis: Secondary | ICD-10-CM | POA: Diagnosis not present

## 2022-03-12 DIAGNOSIS — D2271 Melanocytic nevi of right lower limb, including hip: Secondary | ICD-10-CM | POA: Diagnosis not present

## 2022-03-12 DIAGNOSIS — L821 Other seborrheic keratosis: Secondary | ICD-10-CM | POA: Diagnosis not present

## 2022-03-12 DIAGNOSIS — L814 Other melanin hyperpigmentation: Secondary | ICD-10-CM

## 2022-03-12 DIAGNOSIS — D2362 Other benign neoplasm of skin of left upper limb, including shoulder: Secondary | ICD-10-CM

## 2022-03-12 DIAGNOSIS — C4491 Basal cell carcinoma of skin, unspecified: Secondary | ICD-10-CM

## 2022-03-12 DIAGNOSIS — D229 Melanocytic nevi, unspecified: Secondary | ICD-10-CM

## 2022-03-12 HISTORY — DX: Basal cell carcinoma of skin, unspecified: C44.91

## 2022-03-12 NOTE — Progress Notes (Signed)
Follow-Up Visit   Subjective  Hannah Burke is a 66 y.o. female who presents for the following: Follow-up.  Patient presents for 6 month follow-up Aks of the face and chest. She has 5FU/Calcipotriene cream but has not started yet. She also has a spot on her right hand that came up 1 year ago, itchy at times. She has a history of dysplastic nevus of the right sacrum and history of BCC of the right upper arm.   The following portions of the chart were reviewed this encounter and updated as appropriate:       Review of Systems:  No other skin or systemic complaints except as noted in HPI or Assessment and Plan.  Objective  Well appearing patient in no apparent distress; mood and affect are within normal limits.  A focused examination was performed including face, chest, arms, legs. Relevant physical exam findings are noted in the Assessment and Plan.  Right Dorsal Hand Pink scaly macule  Right Shoulder 1.1 x 0.6 cm pink slightly pearly patch with telangiectasia       Left post ankle 2.5 mm medium dark brown macule      Right lateral ankle 7 mm waxy brown papule with slight irregular border     Assessment & Plan  Actinic Damage with PreCancerous Actinic Keratoses Counseling for Topical Chemotherapy Management: Patient exhibits: - Severe, confluent actinic changes with pre-cancerous actinic keratoses that is secondary to cumulative UV radiation exposure over time - Condition that is severe; chronic, not at goal. - diffuse scaly erythematous macules and papules with underlying dyspigmentation - Discussed Prescription "Field Treatment" topical Chemotherapy for Severe, Chronic Confluent Actinic Changes with Pre-Cancerous Actinic Keratoses Field treatment involves treatment of an entire area of skin that has confluent Actinic Changes (Sun/ Ultraviolet light damage) and PreCancerous Actinic Keratoses by method of PhotoDynamic Therapy (PDT) and/or prescription Topical  Chemotherapy agents such as 5-fluorouracil, 5-fluorouracil/calcipotriene, and/or imiquimod.  The purpose is to decrease the number of clinically evident and subclinical PreCancerous lesions to prevent progression to development of skin cancer by chemically destroying early precancer changes that may or may not be visible.  It has been shown to reduce the risk of developing skin cancer in the treated area. As a result of treatment, redness, scaling, crusting, and open sores may occur during treatment course. One or more than one of these methods may be used and may have to be used several times to control, suppress and eliminate the PreCancerous changes. Discussed treatment course, expected reaction, and possible side effects. - Recommend daily broad spectrum sunscreen SPF 30+ to sun-exposed areas, reapply every 2 hours as needed.  - Staying in the shade or wearing long sleeves, sun glasses (UVA+UVB protection) and wide brim hats (4-inch brim around the entire circumference of the hat) are also recommended. - Call for new or changing lesions.  Start 5-fluorouracil/calcipotriene cream twice a day for 7 days to affected areas including face, 10-14 days to chest. Prescription previously sent to [facility], patient has at home. Patient provided with contact information for pharmacy and advised the pharmacy will mail the prescription to their home. Patient provided with handout reviewing treatment course and side effects and advised to call or message [place] on MyChart with any concerns.   Lentigines - Scattered tan macules - Due to sun exposure - Benign-appearing, observe - Recommend daily broad spectrum sunscreen SPF 30+ to sun-exposed areas, reapply every 2 hours as needed. - Call for any changes  Seborrheic Keratoses - Stuck-on,  waxy, tan-brown papules and/or plaques, including spinal lower back  - Benign-appearing - Discussed benign etiology and prognosis. - Observe - Call for any  changes  History of Dysplastic Nevus - No evidence of recurrence today of the right sacrum - Recommend regular full body skin exams - Recommend daily broad spectrum sunscreen SPF 30+ to sun-exposed areas, reapply every 2 hours as needed.  - Call if any new or changing lesions are noted between office visits  Dermatofibroma - Firm pink/brown papulenodule with dimple sign, left upper arm - Benign appearing - Call for any changes  History of Basal Cell Carcinoma of the Skin - No evidence of recurrence today, right upper arm - Recommend regular full body skin exams - Recommend daily broad spectrum sunscreen SPF 30+ to sun-exposed areas, reapply every 2 hours as needed.  - Call if any new or changing lesions are noted between office visits    AK (actinic keratosis) Right Dorsal Hand  Actinic keratoses are precancerous spots that appear secondary to cumulative UV radiation exposure/sun exposure over time. They are chronic with expected duration over 1 year. A portion of actinic keratoses will progress to squamous cell carcinoma of the skin. It is not possible to reliably predict which spots will progress to skin cancer and so treatment is recommended to prevent development of skin cancer.  Recommend daily broad spectrum sunscreen SPF 30+ to sun-exposed areas, reapply every 2 hours as needed.  Recommend staying in the shade or wearing long sleeves, sun glasses (UVA+UVB protection) and wide brim hats (4-inch brim around the entire circumference of the hat). Call for new or changing lesions.  Destruction of lesion - Right Dorsal Hand  Destruction method: cryotherapy   Informed consent: discussed and consent obtained   Lesion destroyed using liquid nitrogen: Yes   Region frozen until ice ball extended beyond lesion: Yes   Outcome: patient tolerated procedure well with no complications   Post-procedure details: wound care instructions given   Additional details:  Prior to procedure,  discussed risks of blister formation, small wound, skin dyspigmentation, or rare scar following cryotherapy. Recommend Vaseline ointment to treated areas while healing.   Neoplasm of uncertain behavior of skin Right Shoulder  Skin / nail biopsy Type of biopsy: tangential   Informed consent: discussed and consent obtained   Patient was prepped and draped in usual sterile fashion: Area prepped with alcohol. Anesthesia: the lesion was anesthetized in a standard fashion   Anesthetic:  1% lidocaine w/ epinephrine 1-100,000 buffered w/ 8.4% NaHCO3 Instrument used: flexible razor blade   Hemostasis achieved with: pressure, aluminum chloride and electrodesiccation   Outcome: patient tolerated procedure well    Destruction of lesion  Destruction method: electrodesiccation and curettage   Informed consent: discussed and consent obtained   Curettage performed in three different directions: Yes   Electrodesiccation performed over the curetted area: Yes   Final wound size (cm):  1.3 Hemostasis achieved with:  pressure, aluminum chloride and electrodesiccation Outcome: patient tolerated procedure well with no complications   Post-procedure details: wound care instructions given   Post-procedure details comment:  Ointment and bandage applied.  Specimen 1 - Surgical pathology Differential Diagnosis: r/o BCC Check Margins: No EDC today  Nevus Left post ankle  Benign-appearing, stable.  Observation.  Call clinic for new or changing moles.  Recommend daily use of broad spectrum spf 30+ sunscreen to sun-exposed areas.   Seborrheic keratosis Right lateral ankle  Reassured benign age-related growth.  Recommend observation, stable.  Discussed cryotherapy if  spot(s) become irritated or inflamed.   Return in about 6 months (around 09/10/2022) for AKs, BCC f/up.  IJamesetta Orleans, CMA, am acting as scribe for Brendolyn Patty, MD .  Documentation: I have reviewed the above documentation for accuracy  and completeness, and I agree with the above.  Brendolyn Patty MD

## 2022-03-12 NOTE — Patient Instructions (Addendum)
- Start 5-fluorouracil/calcipotriene cream twice a day for 5-7 days to affected areas including face. Apply to chest twice a day for 7-10 days. Reviewed course of treatment and expected reaction.  Patient advised to expect inflammation and crusting and advised that erosions are possible.  Patient advised to be diligent with sun protection during and after treatment. Counseled to keep medication out of reach of children and pets.  5-Fluorouracil/Calcipotriene Patient Education   Actinic keratoses are the dry, red scaly spots on the skin caused by sun damage. A portion of these spots can turn into skin cancer with time, and treating them can help prevent development of skin cancer.   Treatment of these spots requires removal of the defective skin cells. There are various ways to remove actinic keratoses, including freezing with liquid nitrogen, treatment with creams, or treatment with a blue light procedure in the office.   5-fluorouracil cream is a topical cream used to treat actinic keratoses. It works by interfering with the growth of abnormal fast-growing skin cells, such as actinic keratoses. These cells peel off and are replaced by healthy ones.   5-fluorouracil/calcipotriene is a combination of the 5-fluorouracil cream with a vitamin D analog cream called calcipotriene. The calcipotriene alone does not treat actinic keratoses. However, when it is combined with 5-fluorouracil, it helps the 5-fluorouracil treat the actinic keratoses much faster so that the same results can be achieved with a much shorter treatment time.  INSTRUCTIONS FOR 5-FLUOROURACIL/CALCIPOTRIENE CREAM:   5-fluorouracil/calcipotriene cream typically only needs to be used for 4-7 days. A thin layer should be applied twice a day to the treatment areas recommended by your physician.   If your physician prescribed you separate tubes of 5-fluourouracil and calcipotriene, apply a thin layer of 5-fluorouracil followed by a thin layer  of calcipotriene.   Avoid contact with your eyes, nostrils, and mouth. Do not use 5-fluorouracil/calcipotriene cream on infected or open wounds.   You will develop redness, irritation and some crusting at areas where you have pre-cancer damage/actinic keratoses. IF YOU DEVELOP PAIN, BLEEDING, OR SIGNIFICANT CRUSTING, STOP THE TREATMENT EARLY - you have already gotten a good response and the actinic keratoses should clear up well.  Wash your hands after applying 5-fluorouracil 5% cream on your skin.   A moisturizer or sunscreen with a minimum SPF 30 should be applied each morning.   Once you have finished the treatment, you can apply a thin layer of Vaseline twice a day to irritated areas to soothe and calm the areas more quickly. If you experience significant discomfort, contact your physician.  For some patients it is necessary to repeat the treatment for best results.  SIDE EFFECTS: When using 5-fluorouracil/calcipotriene cream, you may have mild irritation, such as redness, dryness, swelling, or a mild burning sensation. This usually resolves within 2 weeks. The more actinic keratoses you have, the more redness and inflammation you can expect during treatment. Eye irritation has been reported rarely. If this occurs, please let us know.  If you have any trouble using this cream, please call the office. If you have any other questions about this information, please do not hesitate to ask me before you leave the office.  Cryotherapy Aftercare  Wash gently with soap and water everyday.   Apply Vaseline and Band-Aid daily until healed.   Wound Care Instructions  Cleanse wound gently with soap and water once a day then pat dry with clean gauze. Apply a thin coat of Petrolatum (petroleum jelly, "Vaseline") over the wound (  unless you have an allergy to this). We recommend that you use a new, sterile tube of Vaseline. Do not pick or remove scabs. Do not remove the yellow or white "healing tissue"  from the base of the wound.  Cover the wound with fresh, clean, nonstick gauze and secure with paper tape. You may use Band-Aids in place of gauze and tape if the wound is small enough, but would recommend trimming much of the tape off as there is often too much. Sometimes Band-Aids can irritate the skin.  You should call the office for your biopsy report after 1 week if you have not already been contacted.  If you experience any problems, such as abnormal amounts of bleeding, swelling, significant bruising, significant pain, or evidence of infection, please call the office immediately.  FOR ADULT SURGERY PATIENTS: If you need something for pain relief you may take 1 extra strength Tylenol (acetaminophen) AND 2 Ibuprofen ('200mg'$  each) together every 4 hours as needed for pain. (do not take these if you are allergic to them or if you have a reason you should not take them.) Typically, you may only need pain medication for 1 to 3 days.   Due to recent changes in healthcare laws, you may see results of your pathology and/or laboratory studies on MyChart before the doctors have had a chance to review them. We understand that in some cases there may be results that are confusing or concerning to you. Please understand that not all results are received at the same time and often the doctors may need to interpret multiple results in order to provide you with the best plan of care or course of treatment. Therefore, we ask that you please give [place] 2 business days to thoroughly review all your results before contacting the office for clarification. Should we see a critical lab result, you will be contacted sooner.   If You Need Anything After Your Visit  If you have any questions or concerns for your doctor, please call our main line at [phone] and press option 4 to reach your doctor's medical assistant. If no one answers, please leave a voicemail as directed and we will return your call as soon as possible.  Messages left after 4 pm will be answered the following business day.   You may also send [place] a message via [place]. We typically respond to MyChart messages within 1-2 business days.  For prescription refills, please ask your pharmacy to contact our office. Our fax number is [phone].  If you have an urgent issue when the clinic is closed that cannot wait until the next business day, you can page your doctor at the number below.    Please note that while we do our best to be available for urgent issues outside of office hours, we are not available 24/7.   If you have an urgent issue and are unable to reach [place], you may choose to seek medical care at your doctor's office, retail clinic, urgent care center, or emergency room.  If you have a medical emergency, please immediately call 911 or go to the emergency department.  Pager Numbers  - Dr. Nehemiah Massed: [phone]  - Dr. Laurence Ferrari: [phone]  - Dr. Nicole Kindred: [phone]  In the event of inclement weather, please call our main line at [phone] for an update on the status of any delays or closures.  Dermatology Medication Tips: Please keep the boxes that topical medications come in in order to help keep track of the  instructions about where and how to use these. Pharmacies typically print the medication instructions only on the boxes and not directly on the medication tubes.   If your medication is too expensive, please contact our office at [phone] option 4 or send [place] a message through [place].   We are unable to tell what your co-pay for medications will be in advance as this is different depending on your insurance coverage. However, we may be able to find a substitute medication at lower cost or fill out paperwork to get insurance to cover a needed medication.   If a prior authorization is required to get your medication covered by your insurance company, please allow [place] 1-2 business days to complete this process.  Drug  prices often vary depending on where the prescription is filled and some pharmacies may offer cheaper prices.  The website www.goodrx.com contains coupons for medications through different pharmacies. The prices here do not account for what the cost may be with help from insurance (it may be cheaper with your insurance), but the website can give you the price if you did not use any insurance.  - You can print the associated coupon and take it with your prescription to the pharmacy.  - You may also stop by our office during regular business hours and pick up a GoodRx coupon card.  - If you need your prescription sent electronically to a different pharmacy, notify our office through [facility] or by phone at [phone] option 4.     Si Usted Necesita Algo Despus de Su Visita  Tambin puede enviarnos un mensaje a travs de Pharmacist, community. Por lo general respondemos a los mensajes de MyChart en el transcurso de 1 a 2 das hbiles.  Para renovar recetas, por favor pida a su farmacia que se ponga en contacto con nuestra oficina. Harland Dingwall de fax es [place] [phone].  Si tiene un asunto urgente cuando la clnica est cerrada y que no puede esperar hasta el siguiente da hbil, puede llamar/localizar a su doctor(a) al nmero que aparece a continuacin.   Por favor, tenga en cuenta que aunque hacemos todo lo posible para estar disponibles para asuntos urgentes fuera del horario de [place], no estamos disponibles las 24 horas del da, los 7 das de la [place].   Si tiene un problema urgente y no puede comunicarse con nosotros, puede optar por buscar atencin mdica  en el consultorio de su doctor(a), en una clnica privada, en un centro de atencin urgente o en una sala de emergencias.  Si tiene Engineering geologist, por favor llame inmediatamente al 911 o vaya a la sala de emergencias.  Nmeros de bper  - Dr. Nehemiah Massed: [phone]  - Dra. Moye: [phone]  - Dra. Nicole Kindred:  [phone]  En caso de inclemencias del [place], por favor llame a Johnsie Kindred principal al [phone] para una actualizacin sobre el [place] de cualquier retraso o cierre.  Consejos para la medicacin en dermatologa: Por favor, guarde las cajas en las que vienen los medicamentos de uso tpico para ayudarle a seguir las instrucciones sobre dnde y cmo usarlos. Las farmacias generalmente imprimen las instrucciones del medicamento slo en las cajas y no directamente en los tubos del [place].   Si su medicamento es muy caro, por favor, pngase en contacto con Zigmund Daniel llamando al [phone] y presione la opcin 4 o envenos un mensaje a travs de Pharmacist, community.   No podemos decirle cul ser su copago por los medicamentos por adelantado ya  que esto es diferente dependiendo de la cobertura de su seguro. Sin embargo, es posible que podamos encontrar un medicamento sustituto a Electrical engineer un formulario para que el seguro cubra el medicamento que se considera necesario.   Si se requiere una autorizacin previa para que su compaa de seguros [place] su medicamento, por favor permtanos de 1 a 2 das hbiles para completar este proceso.  Los precios de los medicamentos varan con frecuencia dependiendo del Environmental consultant de dnde se surte la receta y alguna farmacias pueden ofrecer precios ms baratos.  El sitio web www.goodrx.com tiene cupones para medicamentos de Airline pilot. Los precios aqu no tienen en cuenta lo que podra costar con la ayuda del seguro (puede ser ms barato con su seguro), pero el sitio web puede darle el precio si no utiliz Research scientist (physical sciences).  - Puede imprimir el cupn correspondiente y llevarlo con su receta a la farmacia.  - Tambin puede pasar por nuestra oficina durante el horario de atencin regular y Charity fundraiser una tarjeta de cupones de GoodRx.  - Si necesita que su receta se enve electrnicamente a una farmacia diferente, informe a nuestra oficina a travs de  MyChart de [place] o por telfono llamando al [phone] y presione la opcin 4.

## 2022-03-18 ENCOUNTER — Telehealth: Payer: Self-pay

## 2022-03-18 NOTE — Telephone Encounter (Signed)
-----   Message from Brendolyn Patty, MD sent at 03/18/2022  9:04 AM EST ----- Skin , right shoulder SUPERFICIAL BASAL CELL CARCINOMA  BCC skin cancer- already treated with EDC at time of biopsy    - please call patient

## 2022-03-18 NOTE — Telephone Encounter (Signed)
Advised pt of bx result/sh ?

## 2022-10-01 ENCOUNTER — Ambulatory Visit: Payer: Medicare Other | Admitting: Dermatology

## 2022-10-01 VITALS — BP 125/80 | HR 73

## 2022-10-01 DIAGNOSIS — Z872 Personal history of diseases of the skin and subcutaneous tissue: Secondary | ICD-10-CM

## 2022-10-01 DIAGNOSIS — S40862A Insect bite (nonvenomous) of left upper arm, initial encounter: Secondary | ICD-10-CM

## 2022-10-01 DIAGNOSIS — W57XXXA Bitten or stung by nonvenomous insect and other nonvenomous arthropods, initial encounter: Secondary | ICD-10-CM

## 2022-10-01 DIAGNOSIS — S40861A Insect bite (nonvenomous) of right upper arm, initial encounter: Secondary | ICD-10-CM

## 2022-10-01 DIAGNOSIS — L578 Other skin changes due to chronic exposure to nonionizing radiation: Secondary | ICD-10-CM | POA: Diagnosis not present

## 2022-10-01 DIAGNOSIS — L814 Other melanin hyperpigmentation: Secondary | ICD-10-CM

## 2022-10-01 DIAGNOSIS — L570 Actinic keratosis: Secondary | ICD-10-CM

## 2022-10-01 DIAGNOSIS — Z85828 Personal history of other malignant neoplasm of skin: Secondary | ICD-10-CM

## 2022-10-01 DIAGNOSIS — W908XXA Exposure to other nonionizing radiation, initial encounter: Secondary | ICD-10-CM | POA: Diagnosis not present

## 2022-10-01 DIAGNOSIS — L821 Other seborrheic keratosis: Secondary | ICD-10-CM

## 2022-10-01 DIAGNOSIS — X32XXXA Exposure to sunlight, initial encounter: Secondary | ICD-10-CM

## 2022-10-01 DIAGNOSIS — L820 Inflamed seborrheic keratosis: Secondary | ICD-10-CM

## 2022-10-01 NOTE — Patient Instructions (Addendum)
Cryotherapy Aftercare  Wash gently with soap and water everyday.   Apply Vaseline and Band-Aid daily until healed.    5-Fluorouracil/Calcipotriene Patient Education   Actinic keratoses are the dry, red scaly spots on the skin caused by sun damage. A portion of these spots can turn into skin cancer with time, and treating them can help prevent development of skin cancer.   Treatment of these spots requires removal of the defective skin cells. There are various ways to remove actinic keratoses, including freezing with liquid nitrogen, treatment with creams, or treatment with a blue light procedure in the office.   5-fluorouracil cream is a topical cream used to treat actinic keratoses. It works by interfering with the growth of abnormal fast-growing skin cells, such as actinic keratoses. These cells peel off and are replaced by healthy ones.   5-fluorouracil/calcipotriene is a combination of the 5-fluorouracil cream with a vitamin D analog cream called calcipotriene. The calcipotriene alone does not treat actinic keratoses. However, when it is combined with 5-fluorouracil, it helps the 5-fluorouracil treat the actinic keratoses much faster so that the same results can be achieved with a much shorter treatment time.  INSTRUCTIONS FOR 5-FLUOROURACIL/CALCIPOTRIENE CREAM:   5-fluorouracil/calcipotriene cream typically only needs to be used for 4-7 days. A thin layer should be applied twice a day to the treatment areas recommended by your physician.   If your physician prescribed you separate tubes of 5-fluourouracil and calcipotriene, apply a thin layer of 5-fluorouracil followed by a thin layer of calcipotriene.   Avoid contact with your eyes, nostrils, and mouth. Do not use 5-fluorouracil/calcipotriene cream on infected or open wounds.   You will develop redness, irritation and some crusting at areas where you have pre-cancer damage/actinic keratoses. IF YOU DEVELOP PAIN, BLEEDING, OR SIGNIFICANT  CRUSTING, STOP THE TREATMENT EARLY - you have already gotten a good response and the actinic keratoses should clear up well.  Wash your hands after applying 5-fluorouracil 5% cream on your skin.   A moisturizer or sunscreen with a minimum SPF 30 should be applied each morning.   Once you have finished the treatment, you can apply a thin layer of Vaseline twice a day to irritated areas to soothe and calm the areas more quickly. If you experience significant discomfort, contact your physician.  For some patients it is necessary to repeat the treatment for best results.  SIDE EFFECTS: When using 5-fluorouracil/calcipotriene cream, you may have mild irritation, such as redness, dryness, swelling, or a mild burning sensation. This usually resolves within 2 weeks. The more actinic keratoses you have, the more redness and inflammation you can expect during treatment. Eye irritation has been reported rarely. If this occurs, please let us know.  If you have any trouble using this cream, please call the office. If you have any other questions about this information, please do not hesitate to ask me before you leave the office. Due to recent changes in healthcare laws, you may see results of your pathology and/or laboratory studies on MyChart before the doctors have had a chance to review them. We understand that in some cases there may be results that are confusing or concerning to you. Please understand that not all results are received at the same time and often the doctors may need to interpret multiple results in order to provide you with the best plan of care or course of treatment. Therefore, we ask that you please give [place] 2 business days to thoroughly review all your results before contacting  the office for clarification. Should we see a critical lab result, you will be contacted sooner.   If You Need Anything After Your Visit  If you have any questions or concerns for your doctor, please call our main  line at [phone] and press option 4 to reach your doctor's medical assistant. If no one answers, please leave a voicemail as directed and we will return your call as soon as possible. Messages left after 4 pm will be answered the following business day.   You may also send [place] a message via MyChart. We typically respond to MyChart messages within 1-2 business days.  For prescription refills, please ask your pharmacy to contact our office. Our fax number is [phone].  If you have an urgent issue when the clinic is closed that cannot wait until the next business day, you can page your doctor at the number below.    Please note that while we do our best to be available for urgent issues outside of office hours, we are not available 24/7.   If you have an urgent issue and are unable to reach [place], you may choose to seek medical care at your doctor's office, retail clinic, urgent care center, or emergency room.  If you have a medical emergency, please immediately call 911 or go to the emergency department.  Pager Numbers  - Dr. Gwen Pounds: [phone]  - Dr. Neale Burly: [phone]  - Dr. Roseanne Reno: [phone]  In the event of inclement weather, please call our main line at [phone] for an update on the status of any delays or closures.  Dermatology Medication Tips: Please keep the boxes that topical medications come in in order to help keep track of the instructions about where and how to use these. Pharmacies typically print the medication instructions only on the boxes and not directly on the medication tubes.   If your medication is too expensive, please contact our office at [phone] option 4 or send [place] a message through MyChart.   We are unable to tell what your co-pay for medications will be in advance as this is different depending on your insurance coverage. However, we may be able to find a substitute medication at lower cost or fill out paperwork to get insurance to cover a  needed medication.   If a prior authorization is required to get your medication covered by your insurance company, please allow [place] 1-2 business days to complete this process.  Drug prices often vary depending on where the prescription is filled and some pharmacies may offer cheaper prices.  The website www.goodrx.com contains coupons for medications through different pharmacies. The prices here do not account for what the cost may be with help from insurance (it may be cheaper with your insurance), but the website can give you the price if you did not use any insurance.  - You can print the associated coupon and take it with your prescription to the pharmacy.  - You may also stop by our office during regular business hours and pick up a GoodRx coupon card.  - If you need your prescription sent electronically to a different pharmacy, notify our office through [facility] or by phone at [phone] option 4.     Si Usted Necesita Algo Despus de Su Visita  Tambin puede enviarnos un mensaje a travs de Clinical cytogeneticist. Por lo general respondemos a los mensajes de MyChart en el transcurso de 1 a 2 das hbiles.  Para renovar recetas, por favor pida a su  farmacia que se ponga en contacto con nuestra oficina. Annie Sable de fax es [place] [phone].  Si tiene un asunto urgente cuando la clnica est cerrada y que no puede esperar hasta el siguiente da hbil, puede llamar/localizar a su doctor(a) al nmero que aparece a continuacin.   Por favor, tenga en cuenta que aunque hacemos todo lo posible para estar disponibles para asuntos urgentes fuera del horario de [place], no estamos disponibles las 24 horas del da, los 7 [address]  [address] de la [place].   Si tiene un problema urgente y no puede comunicarse con nosotros, puede optar por buscar atencin mdica  en el consultorio de su doctor(a), en una clnica privada, en un centro de atencin urgente o en una sala de emergencias.  Si tiene Psychologist, clinical, por favor llame inmediatamente al 911 o vaya a la sala de emergencias.  Nmeros de bper  - Dr. Gwen Pounds: [phone]  - Dra. Moye: [phone]  - Dra. Roseanne Reno: [phone]  En caso de inclemencias del [place], por favor llame a Lacy Duverney principal al [phone] para una actualizacin sobre el [place] de cualquier retraso o cierre.  Consejos para la medicacin en dermatologa: Por favor, guarde las cajas en las que vienen los medicamentos de uso tpico para ayudarle a seguir las instrucciones sobre dnde y cmo usarlos. Las farmacias generalmente imprimen las instrucciones del medicamento slo en las cajas y no directamente en los tubos del [place].   Si su medicamento es muy caro, por favor, pngase en contacto con Rolm Gala llamando al [phone] y presione la opcin 4 o envenos un mensaje a travs de Clinical cytogeneticist.   No podemos decirle cul ser su copago por los medicamentos por adelantado ya que esto es diferente dependiendo de la cobertura de su seguro. Sin embargo, es posible que podamos encontrar un medicamento sustituto a Audiological scientist un formulario para que el seguro cubra el medicamento que se considera necesario.   Si se requiere una autorizacin previa para que su compaa de seguros [place] su medicamento, por favor permtanos de 1 a 2 das hbiles para completar [address]  Los precios de los medicamentos varan con frecuencia dependiendo del Environmental consultant de dnde se surte la receta y alguna farmacias pueden ofrecer precios ms baratos.  El sitio web www.goodrx.com tiene cupones para medicamentos de Health and safety inspector. Los precios aqu no tienen en cuenta lo que podra costar con la ayuda del seguro (puede ser ms barato con su seguro), pero el sitio web puede darle el precio si no utiliz Tourist information centre manager.  - Puede imprimir el cupn correspondiente y llevarlo con su receta a la farmacia.  - Tambin puede pasar por nuestra oficina durante el horario de  atencin regular y Education officer, museum una tarjeta de cupones de GoodRx.  - Si necesita que su receta se enve electrnicamente a una farmacia diferente, informe a nuestra oficina a travs de MyChart de [place] o por telfono llamando al [phone] y presione la opcin 4.

## 2022-10-01 NOTE — Progress Notes (Signed)
Follow-Up Visit   Subjective  Hannah Burke is a 67 y.o. female who presents for the following: 6 month follow-up AKs and history of BCC. Patient used 5FU/Calcipotriene to the face x 7 days and chest x 13 days in early Feb. She got a good reaction to both, but more on the chest. BCC of the right shoulder treated with EDC at last visit. She also has a spot on the lower back, itchy at times, and irritated spots on the face.    The following portions of the chart were reviewed this encounter and updated as appropriate: medications, allergies, medical history  Review of Systems:  No other skin or systemic complaints except as noted in HPI or Assessment and Plan.  Objective  Well appearing patient in no apparent distress; mood and affect are within normal limits.  A focused examination was performed of the following areas: Face, arms, back  Relevant exam findings are noted in the Assessment and Plan.  L upper temple x 3, spinal lower back x 1 (4) Erythematous stuck-on, waxy papule  upper arms Pink excoriated papules on the bilateral upper arms.     Assessment & Plan   ACTINIC DAMAGE WITH PRECANCEROUS ACTINIC KERATOSES Counseling for Topical Chemotherapy Management: Patient exhibits: - Severe, confluent actinic changes with pre-cancerous actinic keratoses that is secondary to cumulative UV radiation exposure over time - Condition that is severe; chronic, not at goal. - diffuse scaly erythematous macules and papules with underlying dyspigmentation - Discussed Prescription "Field Treatment" topical Chemotherapy for Severe, Chronic Confluent Actinic Changes with Pre-Cancerous Actinic Keratoses Field treatment involves treatment of an entire area of skin that has confluent Actinic Changes (Sun/ Ultraviolet light damage) and PreCancerous Actinic Keratoses by method of PhotoDynamic Therapy (PDT) and/or prescription Topical Chemotherapy agents such as 5-fluorouracil,  5-fluorouracil/calcipotriene, and/or imiquimod.  The purpose is to decrease the number of clinically evident and subclinical PreCancerous lesions to prevent progression to development of skin cancer by chemically destroying early precancer changes that may or may not be visible.  It has been shown to reduce the risk of developing skin cancer in the treated area. As a result of treatment, redness, scaling, crusting, and open sores may occur during treatment course. One or more than one of these methods may be used and may have to be used several times to control, suppress and eliminate the PreCancerous changes. Discussed treatment course, expected reaction, and possible side effects. - Recommend daily broad spectrum sunscreen SPF 30+ to sun-exposed areas, reapply every 2 hours as needed.  - Staying in the shade or wearing long sleeves, sun glasses (UVA+UVB protection) and wide brim hats (4-inch brim around the entire circumference of the hat) are also recommended. - Call for new or changing lesions. - Good result on the face and chest with 5FU/Calcipotriene Cream. May repeat this winter. Pt has cream.   Inflamed seborrheic keratosis (4) L upper temple x 3, spinal lower back x 1  Symptomatic, irritating, patient would like treated.  Destruction of lesion - L upper temple x 3, spinal lower back x 1  Destruction method: cryotherapy   Informed consent: discussed and consent obtained   Lesion destroyed using liquid nitrogen: Yes   Region frozen until ice ball extended beyond lesion: Yes   Outcome: patient tolerated procedure well with no complications   Post-procedure details: wound care instructions given   Additional details:  Prior to procedure, discussed risks of blister formation, small wound, skin dyspigmentation, or rare scar following cryotherapy. Recommend  Vaseline ointment to treated areas while healing.   Bug bite without infection, initial encounter upper arms  Benign, observe.    Start OTC 1% hydrocortisone cream to AA prn itch.  SEBORRHEIC KERATOSIS - Stuck-on, waxy, tan-brown papules and/or plaques, including chest, face  - Benign-appearing - Discussed benign etiology and prognosis. - Observe - Call for any changes  LENTIGINES Exam: scattered tan macules Due to sun exposure Treatment Plan: Benign-appearing, observe. Recommend daily broad spectrum sunscreen SPF 30+ to sun-exposed areas, reapply every 2 hours as needed.  Call for any changes  HISTORY OF BASAL CELL CARCINOMA OF THE SKIN - No evidence of recurrence today of the right shoulder and right upper arm. - Recommend regular full body skin exams - Recommend daily broad spectrum sunscreen SPF 30+ to sun-exposed areas, reapply every 2 hours as needed.  - Call if any new or changing lesions are noted between office visits  HISTORY OF PRECANCEROUS ACTINIC KERATOSIS - site(s) of PreCancerous Actinic Keratosis clear today. - these may recur and new lesions may form requiring treatment to prevent transformation into skin cancer - observe for new or changing spots and contact [facility] for appointment if occur - photoprotection with sun protective clothing; sunglasses and broad spectrum sunscreen with SPF of at least 30 + and frequent self skin exams recommended - yearly exams by a dermatologist recommended for persons with history of PreCancerous Actinic Keratoses   Return for as scheduled.  ICherlyn Labella, CMA, am acting as scribe for Willeen Niece, MD .   Documentation: I have reviewed the above documentation for accuracy and completeness, and I agree with the above.  Willeen Niece, MD

## 2022-12-20 ENCOUNTER — Other Ambulatory Visit: Payer: Self-pay | Admitting: Obstetrics and Gynecology

## 2022-12-20 DIAGNOSIS — Z1231 Encounter for screening mammogram for malignant neoplasm of breast: Secondary | ICD-10-CM

## 2023-01-10 ENCOUNTER — Ambulatory Visit
Admission: RE | Admit: 2023-01-10 | Discharge: 2023-01-10 | Disposition: A | Discharge status: Home / Self Care | Payer: Medicare Other | Source: Ambulatory Visit | Attending: Obstetrics and Gynecology | Admitting: Obstetrics and Gynecology

## 2023-01-10 DIAGNOSIS — Z1231 Encounter for screening mammogram for malignant neoplasm of breast: Secondary | ICD-10-CM | POA: Insufficient documentation

## 2023-04-14 ENCOUNTER — Ambulatory Visit: Payer: Medicare Other | Admitting: Dermatology

## 2023-04-14 DIAGNOSIS — L814 Other melanin hyperpigmentation: Secondary | ICD-10-CM

## 2023-04-14 DIAGNOSIS — L304 Erythema intertrigo: Secondary | ICD-10-CM | POA: Diagnosis not present

## 2023-04-14 DIAGNOSIS — D492 Neoplasm of unspecified behavior of bone, soft tissue, and skin: Secondary | ICD-10-CM | POA: Diagnosis not present

## 2023-04-14 DIAGNOSIS — D239 Other benign neoplasm of skin, unspecified: Secondary | ICD-10-CM

## 2023-04-14 DIAGNOSIS — L570 Actinic keratosis: Secondary | ICD-10-CM

## 2023-04-14 DIAGNOSIS — D2271 Melanocytic nevi of right lower limb, including hip: Secondary | ICD-10-CM

## 2023-04-14 DIAGNOSIS — L578 Other skin changes due to chronic exposure to nonionizing radiation: Secondary | ICD-10-CM

## 2023-04-14 DIAGNOSIS — C44612 Basal cell carcinoma of skin of right upper limb, including shoulder: Secondary | ICD-10-CM

## 2023-04-14 DIAGNOSIS — L729 Follicular cyst of the skin and subcutaneous tissue, unspecified: Secondary | ICD-10-CM

## 2023-04-14 DIAGNOSIS — D2362 Other benign neoplasm of skin of left upper limb, including shoulder: Secondary | ICD-10-CM

## 2023-04-14 DIAGNOSIS — W908XXA Exposure to other nonionizing radiation, initial encounter: Secondary | ICD-10-CM

## 2023-04-14 DIAGNOSIS — D229 Melanocytic nevi, unspecified: Secondary | ICD-10-CM

## 2023-04-14 DIAGNOSIS — D489 Neoplasm of uncertain behavior, unspecified: Secondary | ICD-10-CM

## 2023-04-14 DIAGNOSIS — Z1283 Encounter for screening for malignant neoplasm of skin: Secondary | ICD-10-CM | POA: Diagnosis not present

## 2023-04-14 DIAGNOSIS — L821 Other seborrheic keratosis: Secondary | ICD-10-CM

## 2023-04-14 DIAGNOSIS — Z85828 Personal history of other malignant neoplasm of skin: Secondary | ICD-10-CM

## 2023-04-14 DIAGNOSIS — D1801 Hemangioma of skin and subcutaneous tissue: Secondary | ICD-10-CM

## 2023-04-14 DIAGNOSIS — L820 Inflamed seborrheic keratosis: Secondary | ICD-10-CM

## 2023-04-14 DIAGNOSIS — L720 Epidermal cyst: Secondary | ICD-10-CM

## 2023-04-14 DIAGNOSIS — Z86018 Personal history of other benign neoplasm: Secondary | ICD-10-CM

## 2023-04-14 DIAGNOSIS — I781 Nevus, non-neoplastic: Secondary | ICD-10-CM

## 2023-04-14 MED ORDER — NYSTATIN 100000 UNIT/GM EX POWD
CUTANEOUS | 2 refills | Status: AC
Start: 2023-04-14 — End: ?

## 2023-04-14 NOTE — Progress Notes (Signed)
Follow-Up Visit   Subjective  Hannah Burke is a 67 y.o. female who presents for the following: Skin Cancer Screening and Full Body Skin Exam Hx of aks, hx of isks, hx of dysplastic nevi, hx of bcc  Spot at right arm noticed summer- stays pink and bleeds at times   The patient presents for Total-Body Skin Exam (TBSE) for skin cancer screening and mole check. The patient has spots, moles and lesions to be evaluated, some may be new or changing and the patient may have concern these could be cancer.    The following portions of the chart were reviewed this encounter and updated as appropriate: medications, allergies, medical history  Review of Systems:  No other skin or systemic complaints except as noted in HPI or Assessment and Plan.  Objective  Well appearing patient in no apparent distress; mood and affect are within normal limits.  A full examination was performed including scalp, head, eyes, ears, nose, lips, neck, chest, axillae, abdomen, back, buttocks, bilateral upper extremities, bilateral lower extremities, hands, feet, fingers, toes, fingernails, and toenails. All findings within normal limits unless otherwise noted below.   Relevant physical exam findings are noted in the Assessment and Plan.  right forearm 10 x 5 mm pink macule   left cheek Erythematous stuck-on, waxy papule or plaque left cheek Erythematous thin papules/macules with gritty scale.   Assessment & Plan   SKIN CANCER SCREENING PERFORMED TODAY.    LENTIGINES, SEBORRHEIC KERATOSES, HEMANGIOMAS - Benign normal skin lesions - Benign-appearing - Call for any changes  Seborrheic keratosis Right Lateral Ankle x 1  7 mm waxy brown papule with slight irregular border    Benign-appearing.  Stable. Observation.  Call clinic for new or changing lesions.  Recommend daily use of broad spectrum spf 30+ sunscreen to sun-exposed areas.   EPIDERMAL INCLUSION CYST Exam: Subcutaneous nodule at left  neck  Benign-appearing. Exam most consistent with an epidermal inclusion cyst. Discussed that a cyst is a benign growth that can grow over time and sometimes get irritated or inflamed. Recommend observation if it is not bothersome. Discussed option of surgical excision to remove it if it is growing, symptomatic, or other changes noted. Please call for new or changing lesions so they can be evaluated.    Dermatofibroma - Firm pink/brown papulenodule with dimple sign, left upper arm - Benign appearing - Call for any changes  MELANOCYTIC NEVI - Tan-brown and/or pink-flesh-colored symmetric macules and papules - Benign appearing on exam today - Observation - Call clinic for new or changing moles - Recommend daily use of broad spectrum spf 30+ sunscreen to sun-exposed areas.   Nevus  right medial posterior thigh 4 mm speckled medium brown macule    Left Ankle - Posterior 2 mm medium dark brown macule     right gluteal cleft 4 mm dark brown thin papule   Left poster shoulder  3 mm speckled brown macule- Nevus vs lentigo    Benign-appearing. Stable compared to previous visit. Observation.  Call clinic for new or changing moles.  Recommend daily use of broad spectrum spf 30+ sunscreen to sun-exposed areas.    TELANGIECTASIA Exam: right anterior alar rim 1.5 mm blanching red macule  Treatment Plan: Benign appearing on exam, Stable Call for changes   INTERTRIGO Exam: Erythematous macerated patches in right inframammary   Chronic and persistent condition with duration or expected duration over one year. Condition is improving with treatment but not currently at goal.   Intertrigo is  a chronic recurrent rash that occurs in skin fold areas that may be associated with friction; heat; moisture; yeast; fungus; and bacteria.  It is exacerbated by increased movement / activity; sweating; and higher atmospheric temperature.  Use of an absorbant powder such as Zeasorb AF powder or  other OTC antifungal powder to the area daily can prevent rash recurrence. Other options to help keep the area dry include blow drying the area after bathing or using antiperspirant products such as Duradry sweat minimizing gel.  Treatment Plan: Continue Nystatin Powder QD to AA prn.    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  Call if any new or changing lesions are noted between office visits  HISTORY OF BASAL CELL CARCINOMA OF THE SKIN Right shoulder ED&C  02/2022 Right upper arm 10/11/2014 and 11/25/2017 - No evidence of recurrence today - Recommend regular full body skin exams - Recommend daily broad spectrum sunscreen SPF 30+ to sun-exposed areas, reapply every 2 hours as needed.  - Call if any new or changing lesions are noted between office visits   ERYTHEMA INTERTRIGO   Related Medications nystatin powder Apply to rash areas 1 - 3 times daily as needed for intertrigo NEOPLASM OF UNCERTAIN BEHAVIOR right forearm Epidermal / dermal shaving  Lesion diameter (cm):  1 Informed consent: discussed and consent obtained   Patient was prepped and draped in usual sterile fashion: Area prepped with alcohol. Anesthesia: the lesion was anesthetized in a standard fashion   Anesthetic:  1% lidocaine w/ epinephrine 1-100,000 buffered w/ 8.4% NaHCO3 Instrument used: flexible razor blade   Hemostasis achieved with: pressure, aluminum chloride and electrodesiccation   Outcome: patient tolerated procedure well    Destruction of lesion  Destruction method: electrodesiccation and curettage   Informed consent: discussed and consent obtained   Timeout:  patient name, date of birth, surgical site, and procedure verified Curettage performed in three different directions: Yes   Electrodesiccation  performed over the curetted area: Yes   Final wound size (cm):  1.1 Hemostasis achieved with:  pressure, aluminum chloride and electrodesiccation Outcome: patient tolerated procedure well with no complications   Post-procedure details: wound care instructions given   Additional details:  Mupirocin ointment and Bandaid applied  Specimen 1 - Surgical pathology Differential Diagnosis: ak vs bcc   Check Margins: No R/o ak vs bcc   ED&C done today  INFLAMED SEBORRHEIC KERATOSIS left cheek Symptomatic, irritating, patient would like treated.  Deferred treatment today will treat at next followup ACTINIC KERATOSIS left cheek Patient prefers to treat ak at right nasal dorsum with 68f/u cream rather than cryotherapy due to upcoming holidays.  Can restart cream in January  5-fluorouracil/calcipotriene cream twice a day for 7 - 10 days to affected areas including chest and  5 - 7 days on forehead , right side nose and tip of nose. Prescription sent to [facility]. Patient advised they will receive an email to purchase the medication online and have it sent to their home. Patient provided with handout reviewing treatment course and side effects and advised to call or message [place] on MyChart with any concerns.    Actinic keratoses are precancerous spots that appear secondary to cumulative UV radiation exposure/sun exposure over time. They are chronic with expected duration over 1 year. A portion of actinic keratoses will progress to  squamous cell carcinoma of the skin. It is not possible to reliably predict which spots will progress to skin cancer and so treatment is recommended to prevent development of skin cancer.  Reviewed course of treatment and expected reaction.  Patient advised to expect inflammation and crusting and advised that erosions are possible.  Patient advised to be diligent with sun protection during and after treatment. Counseled to keep medication out of reach  of children and pets.    Recommend daily broad spectrum sunscreen SPF 30+ to sun-exposed areas, reapply every 2 hours as needed.  Recommend staying in the shade or wearing long sleeves, sun glasses (UVA+UVB protection) and wide brim hats (4-inch brim around the entire circumference of the hat). Call for new or changing lesions.   ACTINIC DAMAGE WITH PRECANCEROUS ACTINIC KERATOSES Counseling for Topical Chemotherapy Management: Patient exhibits: - Severe, confluent actinic changes with pre-cancerous actinic keratoses that is secondary to cumulative UV radiation exposure over time - Condition that is severe; chronic, not at goal. - diffuse scaly erythematous macules and papules with underlying dyspigmentation - Discussed Prescription "Field Treatment" topical Chemotherapy for Severe, Chronic Confluent Actinic Changes with Pre-Cancerous Actinic Keratoses Field treatment involves treatment of an entire area of skin that has confluent Actinic Changes (Sun/ Ultraviolet light damage) and PreCancerous Actinic Keratoses by method of PhotoDynamic Therapy (PDT) and/or prescription Topical Chemotherapy agents such as 5-fluorouracil, 5-fluorouracil/calcipotriene, and/or imiquimod.  The purpose is to decrease the number of clinically evident and subclinical PreCancerous lesions to prevent progression to development of skin cancer by chemically destroying early precancer changes that may or may not be visible.  It has been shown to reduce the risk of developing skin cancer in the treated area. As a result of treatment, redness, scaling, crusting, and open sores may occur during treatment course. One or more than one of these methods may be used and may have to be used several times to control, suppress and eliminate the PreCancerous changes. Discussed treatment course, expected reaction, and possible side effects. - Recommend daily broad spectrum sunscreen SPF 30+ to sun-exposed areas, reapply every 2 hours as  needed.  - Staying in the shade or wearing long sleeves, sun glasses (UVA+UVB protection) and wide brim hats (4-inch brim around the entire circumference of the hat) are also recommended. - Call for new or changing lesions. Can restart cream in January  5-fluorouracil/calcipotriene cream twice a day for 7 - 10 days to affected areas including chest and  5 - 7 days on forehead , right side nose and tip of nose. Prescription sent to [facility]. Patient advised they will receive an email to purchase the medication online and have it sent to their home. Patient provided with handout reviewing treatment course and side effects and advised to call or message [place] on MyChart with any concerns.   Return for 6 month ak followup hx of bcc , 1 year tbse .  I, Asher Muir, CMA, am acting as scribe for Willeen Niece, MD.   Documentation: I have reviewed the above documentation for accuracy and completeness, and I agree with the above.  Willeen Niece, MD

## 2023-04-14 NOTE — Patient Instructions (Addendum)
Biopsy Wound Care Instructions  Leave the original bandage on for 24 hours if possible.  If the bandage becomes soaked or soiled before that time, it is OK to remove it and examine the wound.  A small amount of post-operative bleeding is normal.  If excessive bleeding occurs, remove the bandage, place gauze over the site and apply continuous pressure (no peeking) over the area for 30 minutes. If this does not work, please call our clinic as soon as possible or page your doctor if it is after hours.   Once a day, cleanse the wound with soap and water. It is fine to shower. If a thick crust develops you may use a Q-tip dipped into dilute hydrogen peroxide (mix 1:1 with water) to dissolve it.  Hydrogen peroxide can slow the healing process, so use it only as needed.    After washing, apply petroleum jelly (Vaseline) or an antibiotic ointment if your doctor prescribed one for you, followed by a bandage.    For best healing, the wound should be covered with a layer of ointment at all times. If you are not able to keep the area covered with a bandage to hold the ointment in place, this may mean re-applying the ointment several times a day.  Continue this wound care until the wound has healed and is no longer open.   Itching and mild discomfort is normal during the healing process. However, if you develop pain or severe itching, please call our office.   If you have any discomfort, you can take Tylenol (acetaminophen) or ibuprofen as directed on the bottle. (Please do not take these if you have an allergy to them or cannot take them for another reason).  Some redness, tenderness and white or yellow material in the wound is normal healing.  If the area becomes very sore and red, or develops a thick yellow-green material (pus), it may be infected; please notify us.    If you have stitches, return to clinic as directed to have the stitches removed. You will continue wound care for 2-3 days after the stitches  are removed.   Wound healing continues for up to one year following surgery. It is not unusual to experience pain in the scar from time to time during the interval.  If the pain becomes severe or the scar thickens, you should notify the office.    A slight amount of redness in a scar is expected for the first six months.  After six months, the redness will fade and the scar will soften and fade.  The color difference becomes less noticeable with time.  If there are any problems, return for a post-op surgery check at your earliest convenience.  To improve the appearance of the scar, you can use silicone scar gel, cream, or sheets (such as Mederma or Serica) every night for up to one year. These are available over the counter (without a prescription).  Please call our office at [phone] for any questions or concerns.       Can restart cream in January  5-fluorouracil/calcipotriene cream twice a day for 7 - 10 days to affected areas including chest and  5 - 7 days on forehead , right side nose and tip of nose. Prescription sent to [facility]. Patient advised they will receive an email to purchase the medication online and have it sent to their home. Patient provided with handout reviewing treatment course and side effects and advised to call or message  [place] on MyChart with any concerns.  Reviewed course of treatment and expected reaction.  Patient advised to expect inflammation and crusting and advised that erosions are possible.  Patient advised to be diligent with sun protection during and after treatment. Counseled to keep medication out of reach of children and pets.    5-Fluorouracil/Calcipotriene Patient Education   Actinic keratoses are the dry, red scaly spots on the skin caused by sun damage. A portion of these spots can turn into skin cancer with time, and treating them can help prevent development of skin cancer.   Treatment of these spots requires  removal of the defective skin cells. There are various ways to remove actinic keratoses, including freezing with liquid nitrogen, treatment with creams, or treatment with a blue light procedure in the office.   5-fluorouracil cream is a topical cream used to treat actinic keratoses. It works by interfering with the growth of abnormal fast-growing skin cells, such as actinic keratoses. These cells peel off and are replaced by healthy ones.   5-fluorouracil/calcipotriene is a combination of the 5-fluorouracil cream with a vitamin D analog cream called calcipotriene. The calcipotriene alone does not treat actinic keratoses. However, when it is combined with 5-fluorouracil, it helps the 5-fluorouracil treat the actinic keratoses much faster so that the same results can be achieved with a much shorter treatment time.  INSTRUCTIONS FOR 5-FLUOROURACIL/CALCIPOTRIENE CREAM:   5-fluorouracil/calcipotriene cream typically only needs to be used for 4-7 days. A thin layer should be applied twice a day to the treatment areas recommended by your physician.   If your physician prescribed you separate tubes of 5-fluourouracil and calcipotriene, apply a thin layer of 5-fluorouracil followed by a thin layer of calcipotriene.   Avoid contact with your eyes, nostrils, and mouth. Do not use 5-fluorouracil/calcipotriene cream on infected or open wounds.   You will develop redness, irritation and some crusting at areas where you have pre-cancer damage/actinic keratoses. IF YOU DEVELOP PAIN, BLEEDING, OR SIGNIFICANT CRUSTING, STOP THE TREATMENT EARLY - you have already gotten a good response and the actinic keratoses should clear up well.  Wash your hands after applying 5-fluorouracil 5% cream on your skin.   A moisturizer or sunscreen with a minimum SPF 30 should be applied each morning.   Once you have finished the treatment, you can apply a thin layer of Vaseline twice a day to irritated areas to soothe and calm the  areas more quickly. If you experience significant discomfort, contact your physician.  For some patients it is necessary to repeat the treatment for best results.  SIDE EFFECTS: When using 5-fluorouracil/calcipotriene cream, you may have mild irritation, such as redness, dryness, swelling, or a mild burning sensation. This usually resolves within 2 weeks. The more actinic keratoses you have, the more redness and inflammation you can expect during treatment. Eye irritation has been reported rarely. If this occurs, please let us know.  If you have any trouble using this cream, please call the office. If you have any other questions about this information, please do not hesitate to ask me before you leave the office.     Melanoma ABCDEs  Melanoma is the most dangerous type of skin cancer, and is the leading cause of death from skin disease.  You are more likely to develop melanoma if you: Have light-colored skin, light-colored eyes, or red or blond hair Spend a lot of time in the sun Tan regularly, either outdoors or in a tanning bed Have had blistering sunburns, especially  during childhood Have a close family member who has had a melanoma Have atypical moles or large birthmarks  Early detection of melanoma is key since treatment is typically straightforward and cure rates are extremely high if we catch it early.   The first sign of melanoma is often a change in a mole or a new dark spot.  The ABCDE system is a way of remembering the signs of melanoma.  A for asymmetry:  The two halves do not match. B for border:  The edges of the growth are irregular. C for color:  A mixture of colors are present instead of an even brown color. D for diameter:  Melanomas are usually (but not always) greater than 6mm - the size of a pencil eraser. E for evolution:  The spot keeps changing in size, shape, and color.  Please check your skin once per month between visits. You can use a small mirror in front  and a large mirror behind you to keep an eye on the back side or your body.   If you see any new or changing lesions before your next follow-up, please call to schedule a visit.  Please continue daily skin protection including broad spectrum sunscreen SPF 30+ to sun-exposed areas, reapplying every 2 hours as needed when you're outdoors.   Staying in the shade or wearing long sleeves, sun glasses (UVA+UVB protection) and wide brim hats (4-inch brim around the entire circumference of the hat) are also recommended for sun protection.    Due to recent changes in healthcare laws, you may see results of your pathology and/or laboratory studies on MyChart before the doctors have had a chance to review them. We understand that in some cases there may be results that are confusing or concerning to you. Please understand that not all results are received at the same time and often the doctors may need to interpret multiple results in order to provide you with the best plan of care or course of treatment. Therefore, we ask that you please give [place] 2 business days to thoroughly review all your results before contacting the office for clarification. Should we see a critical lab result, you will be contacted sooner.   If You Need Anything After Your Visit  If you have any questions or concerns for your doctor, please call our main line at [phone] and press option 4 to reach your doctor's medical assistant. If no one answers, please leave a voicemail as directed and we will return your call as soon as possible. Messages left after 4 pm will be answered the following business day.   You may also send [place] a message via MyChart. We typically respond to MyChart messages within 1-2 business days.  For prescription refills, please ask your pharmacy to contact our office. Our fax number is [phone].  If you have an urgent issue when the clinic is closed that cannot wait until the next business day, you can page  your doctor at the number below.    Please note that while we do our best to be available for urgent issues outside of office hours, we are not available 24/7.   If you have an urgent issue and are unable to reach [place], you may choose to seek medical care at your doctor's office, retail clinic, urgent care center, or emergency room.  If you have a medical emergency, please immediately call 911 or go to the emergency department.  Pager Numbers  - Dr. Gwen Pounds: [phone]  -  Dr. Roseanne Reno: [phone]  - Dr. Katrinka Blazing: [phone]   In the event of inclement weather, please call our main line at [phone] for an update on the status of any delays or closures.  Dermatology Medication Tips: Please keep the boxes that topical medications come in in order to help keep track of the instructions about where and how to use these. Pharmacies typically print the medication instructions only on the boxes and not directly on the medication tubes.   If your medication is too expensive, please contact our office at [phone] option 4 or send [place] a message through MyChart.   We are unable to tell what your co-pay for medications will be in advance as this is different depending on your insurance coverage. However, we may be able to find a substitute medication at lower cost or fill out paperwork to get insurance to cover a needed medication.   If a prior authorization is required to get your medication covered by your insurance company, please allow [place] 1-2 business days to complete this process.  Drug prices often vary depending on where the prescription is filled and some pharmacies may offer cheaper prices.  The website www.goodrx.com contains coupons for medications through different pharmacies. The prices here do not account for what the cost may be with help from insurance (it may be cheaper with your insurance), but the website can give you the price if you did not use any insurance.  - You can  print the associated coupon and take it with your prescription to the pharmacy.  - You may also stop by our office during regular business hours and pick up a GoodRx coupon card.  - If you need your prescription sent electronically to a different pharmacy, notify our office through [facility] or by phone at [phone] option 4.     Si Usted Necesita Algo Despus de Su Visita  Tambin puede enviarnos un mensaje a travs de Clinical cytogeneticist. Por lo general respondemos a los mensajes de MyChart en el transcurso de 1 a 2 das hbiles.  Para renovar recetas, por favor pida a su farmacia que se ponga en contacto con nuestra oficina. Annie Sable de fax es [place] [phone].  Si tiene un asunto urgente cuando la clnica est cerrada y que no puede esperar hasta el siguiente da hbil, puede llamar/localizar a su doctor(a) al nmero que aparece a continuacin.   Por favor, tenga en cuenta que aunque hacemos todo lo posible para estar disponibles para asuntos urgentes fuera del horario de [place], no estamos disponibles las 24 horas del da, los 7 [address]  [address] de la [place].   Si tiene un problema urgente y no puede comunicarse con nosotros, puede optar por buscar atencin mdica  en el consultorio de su doctor(a), en una clnica privada, en un centro de atencin urgente o en una sala de emergencias.  Si tiene Engineer, drilling, por favor llame inmediatamente al 911 o vaya a la sala de emergencias.  Nmeros de bper  - Dr. Gwen Pounds: [phone]  - Dra. Roseanne Reno: [phone]  - Dr. Katrinka Blazing: [phone]   En caso de inclemencias del tiempo, por favor llame a Lacy Duverney principal al [phone] para una actualizacin sobre el [place] de cualquier retraso o cierre.  Consejos para la medicacin en dermatologa: Por favor, guarde las cajas en las que vienen los medicamentos de uso tpico para ayudarle a seguir las instrucciones sobre dnde y cmo usarlos. Las farmacias generalmente imprimen las  instrucciones del medicamento slo en las  cajas y no directamente en los tubos del medicamento.   Si su medicamento es muy caro, por favor, pngase en contacto con Rolm Gala llamando al [phone] y presione la opcin 4 o envenos un mensaje a travs de Clinical cytogeneticist.   No podemos decirle cul ser su copago por los medicamentos por adelantado ya que esto es diferente dependiendo de la cobertura de su seguro. Sin embargo, es posible que podamos encontrar un medicamento sustituto a Audiological scientist un formulario para que el seguro cubra el medicamento que se considera necesario.   Si se requiere una autorizacin previa para que su compaa de seguros [place] su medicamento, por favor permtanos de 1 a 2 das hbiles para completar [address]  Los precios de los medicamentos varan con frecuencia dependiendo del Environmental consultant de dnde se surte la receta y alguna farmacias pueden ofrecer precios ms baratos.  El sitio web www.goodrx.com tiene cupones para medicamentos de Health and safety inspector. Los precios aqu no tienen en cuenta lo que podra costar con la ayuda del seguro (puede ser ms barato con su seguro), pero el sitio web puede darle el precio si no utiliz Tourist information centre manager.  - Puede imprimir el cupn correspondiente y llevarlo con su receta a la farmacia.  - Tambin puede pasar por nuestra oficina durante el horario de atencin regular y Education officer, museum una tarjeta de cupones de GoodRx.  - Si necesita que su receta se enve electrnicamente a una farmacia diferente, informe a nuestra oficina a travs de MyChart de [place] o por telfono llamando al [phone] y presione la opcin 4.

## 2023-04-16 LAB — SURGICAL PATHOLOGY

## 2023-04-21 ENCOUNTER — Telehealth: Payer: Self-pay

## 2023-04-21 NOTE — Telephone Encounter (Signed)
Advised pt of bx results/sh ?

## 2023-04-21 NOTE — Telephone Encounter (Signed)
-----   Message from Willeen Niece sent at 04/21/2023 12:49 PM EST ----- 1. Skin, right forearm :       BASAL CELL CARCINOMA, SUPERFICIAL AND NODULAR PATTERNS    BCC skin cancer- already treated with EDC at time of biopsy  - please call patient

## 2023-06-24 ENCOUNTER — Encounter: Payer: BC Managed Care – PPO | Admitting: Dermatology

## 2023-10-14 ENCOUNTER — Ambulatory Visit: Payer: BC Managed Care – PPO | Admitting: Dermatology

## 2023-10-14 ENCOUNTER — Encounter: Payer: Self-pay | Admitting: Dermatology

## 2023-10-14 DIAGNOSIS — W908XXD Exposure to other nonionizing radiation, subsequent encounter: Secondary | ICD-10-CM | POA: Diagnosis not present

## 2023-10-14 DIAGNOSIS — L821 Other seborrheic keratosis: Secondary | ICD-10-CM

## 2023-10-14 DIAGNOSIS — D1801 Hemangioma of skin and subcutaneous tissue: Secondary | ICD-10-CM

## 2023-10-14 DIAGNOSIS — T148XXA Other injury of unspecified body region, initial encounter: Secondary | ICD-10-CM

## 2023-10-14 DIAGNOSIS — L820 Inflamed seborrheic keratosis: Secondary | ICD-10-CM | POA: Diagnosis not present

## 2023-10-14 DIAGNOSIS — L570 Actinic keratosis: Secondary | ICD-10-CM | POA: Diagnosis not present

## 2023-10-14 DIAGNOSIS — D2362 Other benign neoplasm of skin of left upper limb, including shoulder: Secondary | ICD-10-CM

## 2023-10-14 DIAGNOSIS — L814 Other melanin hyperpigmentation: Secondary | ICD-10-CM

## 2023-10-14 DIAGNOSIS — Z85828 Personal history of other malignant neoplasm of skin: Secondary | ICD-10-CM

## 2023-10-14 DIAGNOSIS — L578 Other skin changes due to chronic exposure to nonionizing radiation: Secondary | ICD-10-CM | POA: Diagnosis not present

## 2023-10-14 DIAGNOSIS — D239 Other benign neoplasm of skin, unspecified: Secondary | ICD-10-CM

## 2023-10-14 DIAGNOSIS — S50812A Abrasion of left forearm, initial encounter: Secondary | ICD-10-CM

## 2023-10-14 NOTE — Patient Instructions (Addendum)

## 2023-10-14 NOTE — Progress Notes (Signed)
 Follow-Up Visit   Subjective  Hannah Burke is a 68 y.o. female who presents for the following: Actinic keratosis.  The patient has spots, moles and lesions to be evaluated, some may be new or changing. Patient treated face and chest with 5FU/Calcipotriene cream in March with a good reaction, patient has photos on her phone. History of BCC of right forearm treated at last visit. Check spot on glabella, pt picks at.  The following portions of the chart were reviewed this encounter and updated as appropriate: medications, allergies, medical history  Review of Systems:  No other skin or systemic complaints except as noted in HPI or Assessment and Plan.  Objective  Well appearing patient in no apparent distress; mood and affect are within normal limits.  A focused examination was performed of the following areas: Face, arms, chest, back  Relevant exam findings are noted in the Assessment and Plan.  glabella x 1, L chest x 1, L upper eyebrow above scar x 1 (3) Erythematous stuck-on, waxy papule  Assessment & Plan   ACTINIC DAMAGE WITH PRECANCEROUS ACTINIC KERATOSES Counseling for Topical Chemotherapy Management: Patient exhibits: - Severe, confluent actinic changes with pre-cancerous actinic keratoses that is secondary to cumulative UV radiation exposure over time - Condition that is severe; chronic, not at goal. - diffuse scaly erythematous macules and papules with underlying dyspigmentation - Discussed Prescription Field Treatment topical Chemotherapy for Severe, Chronic Confluent Actinic Changes with Pre-Cancerous Actinic Keratoses Field treatment involves treatment of an entire area of skin that has confluent Actinic Changes (Sun/ Ultraviolet light damage) and PreCancerous Actinic Keratoses by method of PhotoDynamic Therapy (PDT) and/or prescription Topical Chemotherapy agents such as 5-fluorouracil , 5-fluorouracil /calcipotriene, and/or imiquimod.  The purpose is to decrease the  number of clinically evident and subclinical PreCancerous lesions to prevent progression to development of skin cancer by chemically destroying early precancer changes that may or may not be visible.  It has been shown to reduce the risk of developing skin cancer in the treated area. As a result of treatment, redness, scaling, crusting, and open sores may occur during treatment course. One or more than one of these methods may be used and may have to be used several times to control, suppress and eliminate the PreCancerous changes. Discussed treatment course, expected reaction, and possible side effects. - Recommend daily broad spectrum sunscreen SPF 30+ to sun-exposed areas, reapply every 2 hours as needed.  - Staying in the shade or wearing long sleeves, sun glasses (UVA+UVB protection) and wide brim hats (4-inch brim around the entire circumference of the hat) are also recommended. - Call for new or changing lesions. - Good results with treatment to chest and face. Recommend patient retreat face and chest once a year.   HEMANGIOMA Exam: red papule(s) Discussed benign nature. Recommend observation. Call for changes.  SEBORRHEIC KERATOSIS - Stuck-on, waxy, tan-brown papules and/or plaques  - Benign-appearing - Discussed benign etiology and prognosis. - Observe - Call for any changes  LENTIGINES Exam: scattered tan macules Due to sun exposure Treatment Plan: Benign-appearing, observe. Recommend daily broad spectrum sunscreen SPF 30+ to sun-exposed areas, reapply every 2 hours as needed.  Call for any changes  HISTORY OF BASAL CELL CARCINOMA OF THE SKIN Right forearm, EDC, 04/14/2023 - No evidence of recurrence today - Recommend regular full body skin exams - Recommend daily broad spectrum sunscreen SPF 30+ to sun-exposed areas, reapply every 2 hours as needed.  - Call if any new or changing lesions are noted between office  visits  DERMATOFIBROMA Exam: 6.0 mm pink tan firm papule at  left upper arm. Present for years per patient with no changes.   Treatment Plan: A dermatofibroma is a benign growth possibly related to trauma, such as an insect bite, cut from shaving, or inflamed acne-type bump.  Treatment options to remove include shave or excision with resulting scar and risk of recurrence.  Since benign-appearing and not bothersome, will observe for now.    EXCORIATION Exam: Healing excoriation at left forearm below elbow.  Treatment Plan: Recommend vaseline. Call if not resolving. Recheck on follow-up.  INFLAMED SEBORRHEIC KERATOSIS (3) glabella x 1, L chest x 1, L upper eyebrow above scar x 1 (3) Symptomatic, irritating, patient would like treated. Destruction of lesion - glabella x 1, L chest x 1, L upper eyebrow above scar x 1 (3)  Destruction method: cryotherapy   Informed consent: discussed and consent obtained   Lesion destroyed using liquid nitrogen: Yes   Region frozen until ice ball extended beyond lesion: Yes   Outcome: patient tolerated procedure well with no complications   Post-procedure details: wound care instructions given   Additional details:  Prior to procedure, discussed risks of blister formation, small wound, skin dyspigmentation, or rare scar following cryotherapy. Recommend Vaseline ointment to treated areas while healing.    Return in about 6 months (around 04/14/2024) for TBSE, Hx BCC, Hx AKs.  IBernardine Bridegroom, CMA, am acting as scribe for Artemio Larry, MD .   Documentation: I have reviewed the above documentation for accuracy and completeness, and I agree with the above.  Artemio Larry, MD

## 2024-02-09 ENCOUNTER — Other Ambulatory Visit: Payer: Self-pay | Admitting: Obstetrics and Gynecology

## 2024-02-09 DIAGNOSIS — Z1231 Encounter for screening mammogram for malignant neoplasm of breast: Secondary | ICD-10-CM

## 2024-02-19 ENCOUNTER — Ambulatory Visit
Admission: RE | Admit: 2024-02-19 | Discharge: 2024-02-19 | Disposition: A | Discharge status: Home / Self Care | Source: Ambulatory Visit | Attending: Obstetrics and Gynecology | Admitting: Obstetrics and Gynecology

## 2024-02-19 DIAGNOSIS — Z1231 Encounter for screening mammogram for malignant neoplasm of breast: Secondary | ICD-10-CM | POA: Diagnosis present

## 2024-03-01 ENCOUNTER — Other Ambulatory Visit: Payer: Self-pay | Admitting: Obstetrics and Gynecology

## 2024-03-01 DIAGNOSIS — R928 Other abnormal and inconclusive findings on diagnostic imaging of breast: Secondary | ICD-10-CM

## 2024-03-03 ENCOUNTER — Ambulatory Visit
Admission: RE | Admit: 2024-03-03 | Discharge: 2024-03-03 | Disposition: A | Discharge status: Home / Self Care | Source: Ambulatory Visit | Attending: Obstetrics and Gynecology | Admitting: Obstetrics and Gynecology

## 2024-03-03 DIAGNOSIS — R928 Other abnormal and inconclusive findings on diagnostic imaging of breast: Secondary | ICD-10-CM | POA: Insufficient documentation

## 2024-03-04 ENCOUNTER — Other Ambulatory Visit: Payer: Self-pay | Admitting: Obstetrics and Gynecology

## 2024-03-04 DIAGNOSIS — R928 Other abnormal and inconclusive findings on diagnostic imaging of breast: Secondary | ICD-10-CM

## 2024-03-10 ENCOUNTER — Ambulatory Visit
Admission: RE | Admit: 2024-03-10 | Discharge: 2024-03-10 | Disposition: A | Discharge status: Home / Self Care | Source: Ambulatory Visit | Attending: Obstetrics and Gynecology | Admitting: Obstetrics and Gynecology

## 2024-03-10 ENCOUNTER — Inpatient Hospital Stay
Admission: RE | Admit: 2024-03-10 | Discharge: 2024-03-10 | Disposition: A | Source: Ambulatory Visit | Attending: Obstetrics and Gynecology | Admitting: Obstetrics and Gynecology

## 2024-03-10 DIAGNOSIS — R928 Other abnormal and inconclusive findings on diagnostic imaging of breast: Secondary | ICD-10-CM | POA: Insufficient documentation

## 2024-03-10 DIAGNOSIS — N6011 Diffuse cystic mastopathy of right breast: Secondary | ICD-10-CM | POA: Insufficient documentation

## 2024-03-10 HISTORY — PX: BREAST BIOPSY: SHX20

## 2024-03-10 MED ORDER — LIDOCAINE-EPINEPHRINE 1 %-1:100000 IJ SOLN
20.0000 mL | Freq: Once | INTRAMUSCULAR | Status: AC
  Administered 2024-03-10: 20 mL
  Filled 2024-03-10: qty 20

## 2024-03-10 MED ORDER — LIDOCAINE 1 % OPTIME INJ - NO CHARGE
5.0000 mL | Freq: Once | INTRAMUSCULAR | Status: AC
  Administered 2024-03-10: 5 mL
  Filled 2024-03-10: qty 6

## 2024-03-11 LAB — SURGICAL PATHOLOGY

## 2024-03-30 ENCOUNTER — Ambulatory Visit: Admitting: Dermatology

## 2024-03-30 ENCOUNTER — Encounter: Payer: Self-pay | Admitting: Dermatology

## 2024-03-30 DIAGNOSIS — D2271 Melanocytic nevi of right lower limb, including hip: Secondary | ICD-10-CM

## 2024-03-30 DIAGNOSIS — D2262 Melanocytic nevi of left upper limb, including shoulder: Secondary | ICD-10-CM

## 2024-03-30 DIAGNOSIS — Z1283 Encounter for screening for malignant neoplasm of skin: Secondary | ICD-10-CM

## 2024-03-30 DIAGNOSIS — D225 Melanocytic nevi of trunk: Secondary | ICD-10-CM

## 2024-03-30 DIAGNOSIS — L570 Actinic keratosis: Secondary | ICD-10-CM

## 2024-03-30 DIAGNOSIS — D2362 Other benign neoplasm of skin of left upper limb, including shoulder: Secondary | ICD-10-CM

## 2024-03-30 DIAGNOSIS — D239 Other benign neoplasm of skin, unspecified: Secondary | ICD-10-CM

## 2024-03-30 DIAGNOSIS — Z85828 Personal history of other malignant neoplasm of skin: Secondary | ICD-10-CM

## 2024-03-30 DIAGNOSIS — L821 Other seborrheic keratosis: Secondary | ICD-10-CM | POA: Diagnosis not present

## 2024-03-30 DIAGNOSIS — L578 Other skin changes due to chronic exposure to nonionizing radiation: Secondary | ICD-10-CM

## 2024-03-30 DIAGNOSIS — D1801 Hemangioma of skin and subcutaneous tissue: Secondary | ICD-10-CM

## 2024-03-30 DIAGNOSIS — W908XXA Exposure to other nonionizing radiation, initial encounter: Secondary | ICD-10-CM

## 2024-03-30 DIAGNOSIS — L814 Other melanin hyperpigmentation: Secondary | ICD-10-CM | POA: Diagnosis not present

## 2024-03-30 DIAGNOSIS — D229 Melanocytic nevi, unspecified: Secondary | ICD-10-CM

## 2024-03-30 DIAGNOSIS — R238 Other skin changes: Secondary | ICD-10-CM

## 2024-03-30 DIAGNOSIS — Z86018 Personal history of other benign neoplasm: Secondary | ICD-10-CM

## 2024-03-30 NOTE — Patient Instructions (Addendum)
 Cryotherapy Aftercare  Wash gently with soap and water everyday.   Apply Vaseline Jelly daily until healed.    Spot on chest: Apply Eucrisa ointment samples once or twice daily.     Recommend daily broad spectrum sunscreen SPF 30+ to sun-exposed areas, reapply every 2 hours as needed. Call for new or changing lesions.  Staying in the shade or wearing long sleeves, sun glasses (UVA+UVB protection) and wide brim hats (4-inch brim around the entire circumference of the hat) are also recommended for sun protection.      Melanoma ABCDEs  Melanoma is the most dangerous type of skin cancer, and is the leading cause of death from skin disease.  You are more likely to develop melanoma if you: Have light-colored skin, light-colored eyes, or red or blond hair Spend a lot of time in the sun Tan regularly, either outdoors or in a tanning bed Have had blistering sunburns, especially during childhood Have a close family member who has had a melanoma Have atypical moles or large birthmarks  Early detection of melanoma is key since treatment is typically straightforward and cure rates are extremely high if we catch it early.   The first sign of melanoma is often a change in a mole or a new dark spot.  The ABCDE system is a way of remembering the signs of melanoma.  A for asymmetry:  The two halves do not match. B for border:  The edges of the growth are irregular. C for color:  A mixture of colors are present instead of an even brown color. D for diameter:  Melanomas are usually (but not always) greater than 6mm - the size of a pencil eraser. E for evolution:  The spot keeps changing in size, shape, and color.  Please check your skin once per month between visits. You can use a small mirror in front and a large mirror behind you to keep an eye on the back side or your body.   If you see any new or changing lesions before your next follow-up, please call to schedule a visit.  Please continue  daily skin protection including broad spectrum sunscreen SPF 30+ to sun-exposed areas, reapplying every 2 hours as needed when you're outdoors.   Staying in the shade or wearing long sleeves, sun glasses (UVA+UVB protection) and wide brim hats (4-inch brim around the entire circumference of the hat) are also recommended for sun protection.      Due to recent changes in healthcare laws, you may see results of your pathology and/or laboratory studies on MyChart before the doctors have had a chance to review them. We understand that in some cases there may be results that are confusing or concerning to you. Please understand that not all results are received at the same time and often the doctors may need to interpret multiple results in order to provide you with the best plan of care or course of treatment. Therefore, we ask that you please give us  2 business days to thoroughly review all your results before contacting the office for clarification. Should we see a critical lab result, you will be contacted sooner.   If You Need Anything After Your Visit  If you have any questions or concerns for your doctor, please call our main line at [phone] and press option 4 to reach your doctor's medical assistant. If no one answers, please leave a voicemail as directed and we will return your call as soon as possible. Messages left after 4  pm will be answered the following business day.   You may also send us  a message via MyChart. We typically respond to MyChart messages within 1-2 business days.  For prescription refills, please ask your pharmacy to contact our office. Our fax number is [phone].  If you have an urgent issue when the clinic is closed that cannot wait until the next business day, you can page your doctor at the number below.    Please note that while we do our best to be available for urgent issues outside of office hours, we are not available 24/7.   If you have an urgent issue  and are unable to reach us , you may choose to seek medical care at your doctor's office, retail clinic, urgent care center, or emergency room.  If you have a medical emergency, please immediately call 911 or go to the emergency department.  Pager Numbers  - Dr. Hester: [phone]  - Dr. Jackquline: [phone]  - Dr. Claudene: [phone]   - Dr. Raymund: [phone]  In the event of inclement weather, please call our main line at [phone] for an update on the status of any delays or closures.  Dermatology Medication Tips: Please keep the boxes that topical medications come in in order to help keep track of the instructions about where and how to use these. Pharmacies typically print the medication instructions only on the boxes and not directly on the medication tubes.   If your medication is too expensive, please contact our office at [phone] option 4 or send us  a message through MyChart.   We are unable to tell what your co-pay for medications will be in advance as this is different depending on your insurance coverage. However, we may be able to find a substitute medication at lower cost or fill out paperwork to get insurance to cover a needed medication.   If a prior authorization is required to get your medication covered by your insurance company, please allow us  1-2 business days to complete this process.  Drug prices often vary depending on where the prescription is filled and some pharmacies may offer cheaper prices.  The website www.goodrx.com contains coupons for medications through different pharmacies. The prices here do not account for what the cost may be with help from insurance (it may be cheaper with your insurance), but the website can give you the price if you did not use any insurance.  - You can print the associated coupon and take it with your prescription to the pharmacy.  - You may also stop by our office during regular business hours and pick up a  GoodRx coupon card.  - If you need your prescription sent electronically to a different pharmacy, notify our office through [facility] or by phone at [phone] option 4.     Si Usted Necesita Algo Despus de Su Visita  Tambin puede enviarnos un mensaje a travs de Clinical Cytogeneticist. Por lo general respondemos a los mensajes de MyChart en el transcurso de 1 a 2 das hbiles.  Para renovar recetas, por favor pida a su farmacia que se ponga en contacto con nuestra oficina. Randi lakes de fax es [place] [phone].  Si tiene un asunto urgente cuando la clnica est cerrada y que no puede esperar hasta el siguiente da hbil, puede llamar/localizar a su doctor(a) al nmero que aparece a continuacin.   Por favor, tenga en cuenta que aunque hacemos todo lo posible para estar disponibles para asuntos urgentes fuera del horario de  oficina, no estamos disponibles las 24 horas del da, los 7 [address]  [address] de la [place].   Si tiene un problema urgente y no puede comunicarse con nosotros, puede optar por buscar atencin mdica  en el consultorio de su doctor(a), en una clnica privada, en un centro de atencin urgente o en una sala de emergencias.  Si tiene engineer, drilling, por favor llame inmediatamente al 911 o vaya a la sala de emergencias.  Nmeros de bper  - Dr. Hester: [phone]  - Dra. Jackquline: [phone]  - Dr. Claudene: [phone]  - Dra. Kitts: [phone]  En caso de inclemencias del [place], por favor llame a nuestra lnea principal al [phone] para una actualizacin sobre el estado de cualquier retraso o cierre.  Consejos para la medicacin en dermatologa: Por favor, guarde las cajas en las que vienen los medicamentos de uso tpico para ayudarle a seguir las instrucciones sobre dnde y cmo usarlos. Las farmacias generalmente imprimen las instrucciones del medicamento slo en las cajas y no directamente en los tubos del [place].   Si su medicamento es muy caro, por  favor, pngase en contacto con landry rieger llamando al [phone] y presione la opcin 4 o envenos un mensaje a travs de Clinical Cytogeneticist.   No podemos decirle cul ser su copago por los medicamentos por adelantado ya que esto es diferente dependiendo de la cobertura de su seguro. Sin embargo, es posible que podamos encontrar un medicamento sustituto a audiological scientist un formulario para que el seguro cubra el medicamento que se considera necesario.   Si se requiere una autorizacin previa para que su compaa de seguros cubra su medicamento, por favor permtanos de 1 a 2 das hbiles para completar este proceso.  Los precios de los medicamentos varan con frecuencia dependiendo del environmental consultant de dnde se surte la receta y alguna farmacias pueden ofrecer precios ms baratos.  El sitio web www.goodrx.com tiene cupones para medicamentos de health and safety inspector. Los precios aqu no tienen en cuenta lo que podra costar con la ayuda del seguro (puede ser ms barato con su seguro), pero el sitio web puede darle el precio si no utiliz tourist information centre manager.  - Puede imprimir el cupn correspondiente y llevarlo con su receta a la farmacia.  - Tambin puede pasar por nuestra oficina durante el horario de atencin regular y education officer, museum una tarjeta de cupones de GoodRx.  - Si necesita que su receta se enve electrnicamente a una farmacia diferente, informe a nuestra oficina a travs de MyChart de [place] o por telfono llamando al [phone] y presione la opcin 4.

## 2024-03-30 NOTE — Progress Notes (Signed)
 Follow-Up Visit   Subjective  Hannah Burke is a 68 y.o. female who presents for the following: Skin Cancer Screening and Full Body Skin Exam. Hx of BCCs. Hx of AKs. Hx of dysplastic nevus.   The patient presents for Total-Body Skin Exam (TBSE) for skin cancer screening and mole check. The patient has spots, moles and lesions to be evaluated, some may be new or changing and the patient may have concern these could be cancer.    The following portions of the chart were reviewed this encounter and updated as appropriate: medications, allergies, medical history  Review of Systems:  No other skin or systemic complaints except as noted in HPI or Assessment and Plan.  Objective  Well appearing patient in no apparent distress; mood and affect are within normal limits.  A full examination was performed including scalp, head, eyes, ears, nose, lips, neck, chest, axillae, abdomen, back, buttocks, bilateral upper extremities, bilateral lower extremities, hands, feet, fingers, toes, fingernails, and toenails. All findings within normal limits unless otherwise noted below.   Relevant physical exam findings are noted in the Assessment and Plan.  Right Shoulder - Posterior x1, R wrist x1, R post upper arm x1 (3) Erythematous thin papules/macules with gritty scale.   Assessment & Plan   SKIN CANCER SCREENING PERFORMED TODAY.  HISTORY OF BASAL CELL CARCINOMA OF THE SKIN. Multiple sites, see history. - No evidence of recurrence today - Recommend regular full body skin exams - Recommend daily broad spectrum sunscreen SPF 30+ to sun-exposed areas, reapply every 2 hours as needed.  - Call if any new or changing lesions are noted between office visits  HISTORY OF DYSPLASTIC NEVUS. Right sacrum. Moderate. 09/04/2021. No evidence of recurrence today Recommend regular full body skin exams Recommend daily broad spectrum sunscreen SPF 30+ to sun-exposed areas, reapply every 2 hours as needed.  Call if  any new or changing lesions are noted between office visits   ACTINIC DAMAGE - Chronic condition, secondary to cumulative UV/sun exposure - diffuse scaly erythematous macules with underlying dyspigmentation - Recommend daily broad spectrum sunscreen SPF 30+ to sun-exposed areas, reapply every 2 hours as needed.  - Staying in the shade or wearing long sleeves, sun glasses (UVA+UVB protection) and wide brim hats (4-inch brim around the entire circumference of the hat) are also recommended for sun protection.  - Call for new or changing lesions.  LENTIGINES, SEBORRHEIC KERATOSES, HEMANGIOMAS - Benign normal skin lesions - Benign-appearing - Call for any changes  MELANOCYTIC NEVI - Tan-brown and/or pink-flesh-colored symmetric macules and papules - right medial posterior thigh 4 mm speckled medium brown macule  - Left Ankle - Posterior 2 mm medium dark brown macule  - right gluteal cleft 4 mm dark brown thin papule  - Left poster shoulder 3 mm speckled brown macule- Nevus vs lentigo  - Benign appearing on exam today - Observation - Call clinic for new or changing moles - Recommend daily use of broad spectrum spf 30+ sunscreen to sun-exposed areas.   Inflammatory papule Exam: Erythematous papule at mid sternum  Treatment: Apply Eucrisa ointment samples once or twice daily. X2 samples given. LotBETHA KIEF Exp: 10/2025 Recheck on f/up   DERMATOFIBROMA Exam: 6.0 mm pink tan firm papule at left upper arm. Present for years per patient with no changes.    Treatment Plan: A dermatofibroma is a benign growth possibly related to trauma, such as an insect bite, cut from shaving, or inflamed acne-type bump.  Treatment options to remove  include shave or excision with resulting scar and risk of recurrence.  Since benign-appearing and not bothersome, will observe for now.   EXCORIATION, resolved Exam: clear today at left forearm below elbow.   Treatment Plan: Observe AK (ACTINIC KERATOSIS)  (3) Right Shoulder - Posterior x1, R wrist x1, R post upper arm x1 (3) Vs early BCC at R shoulder and R post upper arm   Actinic keratoses are precancerous spots that appear secondary to cumulative UV radiation exposure/sun exposure over time. They are chronic with expected duration over 1 year. A portion of actinic keratoses will progress to squamous cell carcinoma of the skin. It is not possible to reliably predict which spots will progress to skin cancer and so treatment is recommended to prevent development of skin cancer.  Recommend daily broad spectrum sunscreen SPF 30+ to sun-exposed areas, reapply every 2 hours as needed.  Recommend staying in the shade or wearing long sleeves, sun glasses (UVA+UVB protection) and wide brim hats (4-inch brim around the entire circumference of the hat). Call for new or changing lesions.  Recheck on follow up. Destruction of lesion - Right Shoulder - Posterior x1, R wrist x1, R post upper arm x1 (3)  Destruction method: cryotherapy   Informed consent: discussed and consent obtained   Lesion destroyed using liquid nitrogen: Yes   Region frozen until ice ball extended beyond lesion: Yes   Outcome: patient tolerated procedure well with no complications   Post-procedure details: wound care instructions given   Additional details:  Prior to procedure, discussed risks of blister formation, small wound, skin dyspigmentation, or rare scar following cryotherapy. Recommend Vaseline ointment to treated areas while healing.   Return in about 6 months (around 09/28/2024) for TBSE, HxBCC, HxDN. Patient prefers TBSE every 6 months.  I, Jill Parcell, CMA, am acting as scribe for Rexene Rattler, MD.   Documentation: I have reviewed the above documentation for accuracy and completeness, and I agree with the above.  Rexene Rattler, MD

## 2024-06-15 ENCOUNTER — Encounter: Payer: BC Managed Care – PPO | Admitting: Dermatology

## 2024-10-19 ENCOUNTER — Ambulatory Visit: Admitting: Dermatology
